# Patient Record
Sex: Male | Born: 1960 | Race: White | Hispanic: No | Marital: Married | Smoking: Never smoker
Health system: Southern US, Community
[De-identification: ages and names within clinical notes are randomized; demographics above are authoritative.]

## PROBLEM LIST (undated history)

## (undated) DIAGNOSIS — K219 Gastro-esophageal reflux disease without esophagitis: Secondary | ICD-10-CM

## (undated) HISTORY — PX: WRIST ARTHROPLASTY: SHX1088

## (undated) HISTORY — PX: ANKLE SURGERY: SHX546

## (undated) HISTORY — PX: TONSILLECTOMY: SUR1361

---

## 2001-01-24 ENCOUNTER — Other Ambulatory Visit
Admission: RE | Admit: 2001-01-24 | Discharge: 2001-01-24 | Disposition: A | Discharge status: Home / Self Care | Payer: BC Managed Care – PPO | Source: Ambulatory Visit | Attending: Urology | Admitting: Urology

## 2001-01-24 DIAGNOSIS — Z302 Encounter for sterilization: Secondary | ICD-10-CM | POA: Insufficient documentation

## 2003-06-11 ENCOUNTER — Encounter: Admission: RE | Admit: 2003-06-11 | Discharge: 2003-06-11 | Payer: Self-pay | Admitting: Gastroenterology

## 2003-07-22 ENCOUNTER — Encounter: Admission: RE | Admit: 2003-07-22 | Discharge: 2003-07-22 | Payer: Self-pay | Admitting: Gastroenterology

## 2004-03-24 ENCOUNTER — Ambulatory Visit (HOSPITAL_BASED_OUTPATIENT_CLINIC_OR_DEPARTMENT_OTHER): Admission: RE | Admit: 2004-03-24 | Discharge: 2004-03-24 | Payer: Self-pay

## 2004-03-24 ENCOUNTER — Ambulatory Visit (HOSPITAL_COMMUNITY): Admission: RE | Admit: 2004-03-24 | Discharge: 2004-03-24 | Payer: Self-pay

## 2005-08-13 HISTORY — PX: INGUINAL HERNIA REPAIR: SUR1180

## 2010-08-13 HISTORY — PX: SHOULDER ARTHROSCOPY: SHX128

## 2011-06-11 ENCOUNTER — Ambulatory Visit (HOSPITAL_COMMUNITY)
Admission: RE | Admit: 2011-06-11 | Discharge: 2011-06-11 | Disposition: A | Discharge status: Home / Self Care | Payer: BC Managed Care – PPO | Source: Ambulatory Visit | Attending: Orthopedic Surgery | Admitting: Orthopedic Surgery

## 2011-06-11 ENCOUNTER — Other Ambulatory Visit (HOSPITAL_COMMUNITY): Payer: Self-pay | Admitting: Orthopedic Surgery

## 2011-06-11 DIAGNOSIS — M25519 Pain in unspecified shoulder: Secondary | ICD-10-CM | POA: Insufficient documentation

## 2011-06-11 DIAGNOSIS — Z01818 Encounter for other preprocedural examination: Secondary | ICD-10-CM | POA: Insufficient documentation

## 2011-06-11 DIAGNOSIS — R52 Pain, unspecified: Secondary | ICD-10-CM

## 2011-07-02 ENCOUNTER — Encounter (HOSPITAL_BASED_OUTPATIENT_CLINIC_OR_DEPARTMENT_OTHER): Payer: Self-pay | Admitting: *Deleted

## 2011-07-02 NOTE — Progress Notes (Signed)
Pt instructed NPO p MN except simvastatin, nexium, androgel.  To [facility] 11/26 @ 1030.  Needs hgb, ekg on arrival.

## 2011-07-09 ENCOUNTER — Encounter (HOSPITAL_BASED_OUTPATIENT_CLINIC_OR_DEPARTMENT_OTHER): Payer: Self-pay | Admitting: Anesthesiology

## 2011-07-09 ENCOUNTER — Ambulatory Visit (HOSPITAL_BASED_OUTPATIENT_CLINIC_OR_DEPARTMENT_OTHER): Payer: BC Managed Care – PPO | Admitting: Anesthesiology

## 2011-07-09 ENCOUNTER — Ambulatory Visit (HOSPITAL_BASED_OUTPATIENT_CLINIC_OR_DEPARTMENT_OTHER)
Admission: RE | Admit: 2011-07-09 | Discharge: 2011-07-09 | Disposition: A | Discharge status: Home / Self Care | Payer: BC Managed Care – PPO | Source: Ambulatory Visit | Attending: Orthopedic Surgery | Admitting: Orthopedic Surgery

## 2011-07-09 ENCOUNTER — Other Ambulatory Visit: Payer: Self-pay

## 2011-07-09 ENCOUNTER — Encounter (HOSPITAL_BASED_OUTPATIENT_CLINIC_OR_DEPARTMENT_OTHER)
Admission: RE | Disposition: A | Discharge status: Home / Self Care | Payer: Self-pay | Source: Ambulatory Visit | Attending: Orthopedic Surgery

## 2011-07-09 DIAGNOSIS — K219 Gastro-esophageal reflux disease without esophagitis: Secondary | ICD-10-CM | POA: Insufficient documentation

## 2011-07-09 DIAGNOSIS — S43439A Superior glenoid labrum lesion of unspecified shoulder, initial encounter: Secondary | ICD-10-CM

## 2011-07-09 DIAGNOSIS — M719 Bursopathy, unspecified: Secondary | ICD-10-CM | POA: Insufficient documentation

## 2011-07-09 DIAGNOSIS — Z79899 Other long term (current) drug therapy: Secondary | ICD-10-CM | POA: Insufficient documentation

## 2011-07-09 DIAGNOSIS — M67919 Unspecified disorder of synovium and tendon, unspecified shoulder: Secondary | ICD-10-CM | POA: Insufficient documentation

## 2011-07-09 DIAGNOSIS — M24119 Other articular cartilage disorders, unspecified shoulder: Secondary | ICD-10-CM | POA: Insufficient documentation

## 2011-07-09 DIAGNOSIS — M19019 Primary osteoarthritis, unspecified shoulder: Secondary | ICD-10-CM | POA: Insufficient documentation

## 2011-07-09 HISTORY — DX: Gastro-esophageal reflux disease without esophagitis: K21.9

## 2011-07-09 LAB — POCT HEMOGLOBIN-HEMACUE: Hemoglobin: 16.6 g/dL (ref 13.0–17.0)

## 2011-07-09 SURGERY — ARTHROSCOPY, SHOULDER, WITH GLENOID LABRUM REPAIR
Anesthesia: General | Laterality: Right

## 2011-07-09 MED ORDER — ROCURONIUM BROMIDE 100 MG/10ML IV SOLN
INTRAVENOUS | Status: DC | PRN
  Administered 2011-07-09: 40 mg via INTRAVENOUS

## 2011-07-09 MED ORDER — EPINEPHRINE HCL 1 MG/ML IJ SOLN
INTRAMUSCULAR | Status: DC | PRN
  Administered 2011-07-09: 1 mg

## 2011-07-09 MED ORDER — BUPIVACAINE-EPINEPHRINE 0.5% -1:200000 IJ SOLN
INTRAMUSCULAR | Status: DC | PRN
  Administered 2011-07-09: 12 mL

## 2011-07-09 MED ORDER — NEOSTIGMINE METHYLSULFATE 1 MG/ML IJ SOLN
INTRAMUSCULAR | Status: DC | PRN
  Administered 2011-07-09: 4 mg via INTRAVENOUS

## 2011-07-09 MED ORDER — MIDAZOLAM HCL 5 MG/5ML IJ SOLN
INTRAMUSCULAR | Status: DC | PRN
  Administered 2011-07-09: 2 mg via INTRAVENOUS

## 2011-07-09 MED ORDER — KETOROLAC TROMETHAMINE 30 MG/ML IJ SOLN
INTRAMUSCULAR | Status: DC | PRN
  Administered 2011-07-09: 30 mg via INTRAVENOUS

## 2011-07-09 MED ORDER — METHOCARBAMOL 500 MG PO TABS: ORAL_TABLET | ORAL | Status: DC

## 2011-07-09 MED ORDER — FENTANYL CITRATE 0.05 MG/ML IJ SOLN
100.0000 ug | Freq: Once | INTRAMUSCULAR | Status: AC
  Administered 2011-07-09: 100 ug via INTRAVENOUS

## 2011-07-09 MED ORDER — DEXAMETHASONE SODIUM PHOSPHATE 4 MG/ML IJ SOLN
INTRAMUSCULAR | Status: DC | PRN
  Administered 2011-07-09: 4 mg via INTRAVENOUS

## 2011-07-09 MED ORDER — LACTATED RINGERS IV SOLN
INTRAVENOUS | Status: DC
  Administered 2011-07-09 (×2): via INTRAVENOUS

## 2011-07-09 MED ORDER — FENTANYL CITRATE 0.05 MG/ML IJ SOLN
INTRAMUSCULAR | Status: DC | PRN
  Administered 2011-07-09: 100 ug via INTRAVENOUS
  Administered 2011-07-09 (×2): 50 ug via INTRAVENOUS

## 2011-07-09 MED ORDER — KETOROLAC TROMETHAMINE 30 MG/ML IJ SOLN: 15.0000 mg | Freq: Once | INTRAMUSCULAR | Status: DC | PRN

## 2011-07-09 MED ORDER — LACTATED RINGERS IV SOLN
INTRAVENOUS | Status: DC | PRN
  Administered 2011-07-09 (×3): via INTRAVENOUS

## 2011-07-09 MED ORDER — PROPOFOL 10 MG/ML IV EMUL
INTRAVENOUS | Status: DC | PRN
  Administered 2011-07-09: 200 mg via INTRAVENOUS

## 2011-07-09 MED ORDER — GLYCOPYRROLATE 0.2 MG/ML IJ SOLN
INTRAMUSCULAR | Status: DC | PRN
  Administered 2011-07-09: .6 mg via INTRAVENOUS

## 2011-07-09 MED ORDER — LIDOCAINE HCL (CARDIAC) 20 MG/ML IV SOLN
INTRAVENOUS | Status: DC | PRN
  Administered 2011-07-09: 100 mg via INTRAVENOUS

## 2011-07-09 MED ORDER — ONDANSETRON HCL 4 MG/2ML IJ SOLN
INTRAMUSCULAR | Status: DC | PRN
  Administered 2011-07-09: 4 mg via INTRAVENOUS

## 2011-07-09 MED ORDER — METOCLOPRAMIDE HCL 5 MG/ML IJ SOLN
10.0000 mg | Freq: Once | INTRAMUSCULAR | Status: AC
  Administered 2011-07-09: 10 mg via INTRAVENOUS

## 2011-07-09 MED ORDER — HYDROMORPHONE HCL PF 1 MG/ML IJ SOLN: 0.2500 mg | INTRAMUSCULAR | Status: DC | PRN

## 2011-07-09 MED ORDER — SODIUM CHLORIDE 0.9 % IR SOLN
Status: DC | PRN
  Administered 2011-07-09: 3000 mL

## 2011-07-09 MED ORDER — ROPIVACAINE HCL 5 MG/ML IJ SOLN
INTRAMUSCULAR | Status: DC | PRN
  Administered 2011-07-09: 25 mL

## 2011-07-09 MED ORDER — MIDAZOLAM HCL 2 MG/2ML IJ SOLN
2.0000 mg | Freq: Once | INTRAMUSCULAR | Status: AC
  Administered 2011-07-09: 2 mg via INTRAVENOUS

## 2011-07-09 SURGICAL SUPPLY — 62 items
APL SKNCLS STERI-STRIP NONHPOA (GAUZE/BANDAGES/DRESSINGS) ×1
BENZOIN TINCTURE PRP APPL 2/3 (GAUZE/BANDAGES/DRESSINGS) ×2 IMPLANT
BLADE 4.2CUDA (BLADE) ×2 IMPLANT
BLADE CUDA 4.2 (BLADE) IMPLANT
BLADE CUDA 5.5 (BLADE) IMPLANT
BLADE CUDA SHAVER 3.5 (BLADE) IMPLANT
BLADE CUTTER GATOR 3.5 (BLADE) IMPLANT
BLADE FLAT COURSE (BLADE) IMPLANT
BLADE GREAT WHITE 4.2 (BLADE) IMPLANT
BUR OVAL 4.0 (BURR) ×2 IMPLANT
CANISTER SUCT LVC 12 LTR MEDI- (MISCELLANEOUS) ×4 IMPLANT
CANISTER SUCTION 1200CC (MISCELLANEOUS) ×2 IMPLANT
CLOTH BEACON ORANGE TIMEOUT ST (SAFETY) ×2 IMPLANT
DRAPE LG THREE QUARTER DISP (DRAPES) ×4 IMPLANT
DRAPE SHOULDER BEACH CHAIR (DRAPES) ×2 IMPLANT
DRAPE U-SHAPE 47X51 STRL (DRAPES) ×2 IMPLANT
DRSG ADAPTIC 3X8 NADH LF (GAUZE/BANDAGES/DRESSINGS) ×2 IMPLANT
DRSG PAD ABDOMINAL 8X10 ST (GAUZE/BANDAGES/DRESSINGS) ×2 IMPLANT
DURAPREP 26ML APPLICATOR (WOUND CARE) ×2 IMPLANT
ELECT MENISCUS 165MM 90D (ELECTRODE) IMPLANT
ELECT REM PT RETURN 9FT ADLT (ELECTROSURGICAL) ×2
ELECTRODE REM PT RTRN 9FT ADLT (ELECTROSURGICAL) ×1 IMPLANT
GLOVE ECLIPSE 8.0 STRL XLNG CF (GLOVE) ×4 IMPLANT
GLOVE INDICATOR 6.5 STRL GRN (GLOVE) ×2 IMPLANT
GLOVE INDICATOR 8.0 STRL GRN (GLOVE) ×2 IMPLANT
GOWN PREVENTION PLUS LG XLONG (DISPOSABLE) ×3 IMPLANT
GOWN STRL REIN XL XLG (GOWN DISPOSABLE) ×3 IMPLANT
IV NS IRRIG 3000ML ARTHROMATIC (IV SOLUTION) ×3 IMPLANT
NDL 1/2 CIR CATGUT .05X1.09 (NEEDLE) IMPLANT
NDL HYPO 18GX1.5 BLUNT FILL (NEEDLE) ×1 IMPLANT
NDL SAFETY ECLIPSE 18X1.5 (NEEDLE) ×1 IMPLANT
NEEDLE 1/2 CIR CATGUT .05X1.09 (NEEDLE) IMPLANT
NEEDLE HYPO 18GX1.5 BLUNT FILL (NEEDLE) ×2 IMPLANT
NEEDLE HYPO 18GX1.5 SHARP (NEEDLE) ×2
NS IRRIG 500ML POUR BTL (IV SOLUTION) ×2 IMPLANT
PACK ARTHROSCOPY DSU (CUSTOM PROCEDURE TRAY) ×2 IMPLANT
PACK BASIN DAY SURGERY FS (CUSTOM PROCEDURE TRAY) ×2 IMPLANT
PENCIL BUTTON HOLSTER BLD 10FT (ELECTRODE) IMPLANT
SET ARTHROSCOPY TUBING (MISCELLANEOUS) ×2
SET ARTHROSCOPY TUBING LN (MISCELLANEOUS) ×1 IMPLANT
SLING ARM IMMOBILIZER LRG (SOFTGOODS) ×1 IMPLANT
SPONGE GAUZE 4X4 12PLY (GAUZE/BANDAGES/DRESSINGS) ×2 IMPLANT
SPONGE SURGIFOAM ABS GEL 100 (HEMOSTASIS) IMPLANT
STRIP CLOSURE SKIN 1/2X4 (GAUZE/BANDAGES/DRESSINGS) ×2 IMPLANT
SUCTION FRAZIER TIP 10 FR DISP (SUCTIONS) ×2 IMPLANT
SUT BONE WAX W31G (SUTURE) IMPLANT
SUT ETHIBOND GREEN BRAID 0S 4 (SUTURE) IMPLANT
SUT ETHIBOND NAB CT1 #1 30IN (SUTURE) IMPLANT
SUT ETHILON 4 0 PS 2 18 (SUTURE) ×3 IMPLANT
SUT VIC AB 0 CT1 36 (SUTURE) IMPLANT
SUT VIC AB 1 CT1 36 (SUTURE) ×2 IMPLANT
SUT VIC AB 2-0 CT1 27 (SUTURE)
SUT VIC AB 2-0 CT1 TAPERPNT 27 (SUTURE) ×2 IMPLANT
SUT VIC AB 3-0 SH 27 (SUTURE)
SUT VIC AB 3-0 SH 27X BRD (SUTURE) IMPLANT
SYR BULB IRRIGATION 50ML (SYRINGE) ×2 IMPLANT
SYRINGE 10CC LL (SYRINGE) ×2 IMPLANT
TAPE HYPAFIX 6X30 (GAUZE/BANDAGES/DRESSINGS) IMPLANT
TOWEL OR 17X24 6PK STRL BLUE (TOWEL DISPOSABLE) ×2 IMPLANT
TUBE CONNECTING 12X1/4 (SUCTIONS) ×2 IMPLANT
WAND 90 DEG TURBOVAC W/CORD (SURGICAL WAND) ×2 IMPLANT
WATER STERILE IRR 500ML POUR (IV SOLUTION) ×2 IMPLANT

## 2011-07-09 NOTE — H&P (Signed)
Zachary Russell is an 50 y.o. male.   Chief Complaint: Rt shoulder pain HPI: MRI demonstrates a torn labrum and rotator cuff tendonitis  Past Medical History  Diagnosis Date  . GERD (gastroesophageal reflux disease)     uses nexium prn    Past Surgical History  Procedure Date  . Inguinal hernia repair 2007    left  . Tonsillectomy   . Ankle surgery     left    History reviewed. No pertinent family history. Social History:  reports that he has never smoked. He does not have any smokeless tobacco history on file. He reports that he drinks alcohol. He reports that he does not use illicit drugs.  Allergies: No Known Allergies  Medications Prior to Admission  Medication Dose Route Frequency Provider Last Rate Last Dose  . fentaNYL (SUBLIMAZE) injection 100 mcg  100 mcg Intravenous Once Riesa Pope, MD   100 mcg at 07/09/11 1211  . lactated ringers infusion   Intravenous Continuous Phillips Grout, MD 100 mL/hr at 07/09/11 1116    . midazolam (VERSED) injection 2 mg  2 mg Intravenous Once Riesa Pope, MD   2 mg at 07/09/11 1212   Medications Prior to Admission  Medication Sig Dispense Refill  . esomeprazole (NEXIUM) 40 MG capsule Take 40 mg by mouth daily as needed.        . fish oil-omega-3 fatty acids 1000 MG capsule Take 1 g by mouth daily.        Marland Kitchen ibuprofen (ADVIL,MOTRIN) 200 MG tablet Take 600 mg by mouth every 6 (six) hours as needed.        . meperidine (DEMEROL) 50 MG tablet Take 50 mg by mouth every 4 (four) hours as needed.        . multivitamin (THERAGRAN) per tablet Take 1 tablet by mouth daily.        . naproxen sodium (ANAPROX) 220 MG tablet Take 220 mg by mouth 2 (two) times daily with a meal.        . simvastatin (ZOCOR) 40 MG tablet Take 40 mg by mouth daily.        Marland Kitchen testosterone (ANDROGEL) 50 MG/5GM GEL Place 5 g onto the skin daily.          Results for orders placed during the hospital encounter of 07/09/11 (from the past 48 hour(s))  POCT  HEMOGLOBIN-HEMACUE     Status: Normal   Collection Time   07/09/11 11:19 AM      Component Value Range Comment   Hemoglobin 16.6  13.0 - 17.0 (g/dL)    No results found.  ROS  Blood pressure 133/84, pulse 71, temperature 98 F (36.7 C), temperature source Oral, resp. rate 10, height 5\' 7"  (1.702 m), weight 81.647 kg (180 lb), SpO2 98.00%. Physical Exam  Vitals reviewed. Constitutional: He is oriented to person, place, and time. He appears well-developed and well-nourished.  HENT:  Head: Normocephalic and atraumatic.  Right Ear: External ear normal.  Left Ear: External ear normal.  Nose: Nose normal.  Mouth/Throat: Oropharynx is clear and moist.  Eyes: EOM are normal. Pupils are equal, round, and reactive to light.  Neck: Normal range of motion. Neck supple.  Cardiovascular: Normal rate, regular rhythm, normal heart sounds and intact distal pulses.   Respiratory: Effort normal and breath sounds normal.  GI: Soft. Bowel sounds are normal.  Musculoskeletal:       He has an inter scalene block  Neurological: He is alert and  oriented to person, place, and time.  Skin: Skin is warm and dry.  Psychiatric: He has a normal mood and affect. His behavior is normal. Judgment and thought content normal.     Assessment/Plan Labral tear; rotator cuff tendonitis;AC arthritis--Rt shoulder Rt shoulder arthroscopy with labral debridement,SAD,DCD Justiss Gerbino P 07/09/2011, 12:54 PM

## 2011-07-09 NOTE — Anesthesia Preprocedure Evaluation (Addendum)
Anesthesia Evaluation  Patient identified by MRN, date of birth, ID band Patient awake    Reviewed: Allergy & Precautions, H&P , NPO status , Patient's Chart, lab work & pertinent test results  Airway Mallampati: III TM Distance: >3 FB Neck ROM: Full    Dental No notable dental hx.    Pulmonary neg pulmonary ROS,  clear to auscultation  Pulmonary exam normal       Cardiovascular neg cardio ROS Regular Normal    Neuro/Psych Negative Neurological ROS  Negative Psych ROS   GI/Hepatic negative GI ROS, Neg liver ROS, GERD-  Medicated,  Endo/Other  Negative Endocrine ROS  Renal/GU negative Renal ROS  Genitourinary negative   Musculoskeletal negative musculoskeletal ROS (+)   Abdominal   Peds negative pediatric ROS (+)  Hematology negative hematology ROS (+)   Anesthesia Other Findings   Reproductive/Obstetrics negative OB ROS                           Anesthesia Physical Anesthesia Plan  ASA: II  Anesthesia Plan: General   Post-op Pain Management:    Induction: Intravenous  Airway Management Planned: Oral ETT  Additional Equipment:   Intra-op Plan:   Post-operative Plan: Extubation in OR  Informed Consent: I have reviewed the patients History and Physical, chart, labs and discussed the procedure including the risks, benefits and alternatives for the proposed anesthesia with the patient or authorized representative who has indicated his/her understanding and acceptance.   Dental advisory given  Plan Discussed with: CRNA  Anesthesia Plan Comments:        Anesthesia Quick Evaluation

## 2011-07-09 NOTE — Transfer of Care (Signed)
Immediate Anesthesia Transfer of Care Note  Patient: Zachary Russell  Procedure(s) Performed:  SHOULDER ARTHROSCOPY WITH LABRAL REPAIR - RIGHT SHOULDER ARTHROSCOPY WITH LABRAL DEBRIDEMENT AND SUBACROMIAL DECOMPRESSION AND DISTAL CLAVICLE DECOMPRESSION   Patient Location: PACU  Anesthesia Type: General  Level of Consciousness: awake, sedated, patient cooperative and responds to stimulation  Airway & Oxygen Therapy: Patient Spontanous Breathing and Patient connected to face mask oxygen  Post-op Assessment: Report given to PACU RN, Post -op Vital signs reviewed and stable and Patient moving all extremities  Post vital signs: Reviewed and stable  Complications: No apparent anesthesia complications

## 2011-07-09 NOTE — Op Note (Signed)
NAMEYAKOV, BERGEN NO.:  [id number]  MEDICAL RECORD NO.:  [id number]  LOCATION:                               FACILITY:  [facility]  PHYSICIAN:  Marlowe Kays, M.D.  DATE OF BIRTH:  [birth year]  DATE OF PROCEDURE:  07/09/2011 DATE OF DISCHARGE:                              OPERATIVE REPORT   PREOPERATIVE DIAGNOSES: 1. Torn labrum. 2. Rotator cuff tendinopathy, right shoulder.  POSTOPERATIVE DIAGNOSES: 1. Torn labrum. 2. Rotator cuff tendinopathy, right shoulder.  OPERATION:  Right shoulder arthroscopy with debridement of labrum; arthroscopic subacromial decompression; arthroscopic distal clavicle decompression.  SURGEON:  Marlowe Kays, M.D.  ASSISTANTDruscilla Brownie. Cherlynn June.  ANESTHESIA:  General.  PATHOLOGY INDICATION FOR PROCEDURE:  Painful right shoulder MRI demonstrating the preoperative diagnoses.  These were confirmed at surgery.  Mr. Angie Fava assistance was required for technical assistance, handling of instrumentation and movement of the arm and holding of the arm during the case.  PROCEDURE:  Interscalene block by anesthesiologist.  Satisfied general anesthesia, beach chair position on the sliding frame.  The right shoulder girdle was prepped with DuraPrep, draped in sterile field. Time-out performed.  The anatomy of shoulder joint was marked out and I infiltrated posterior and lateral portals with Marcaine with adrenaline for hemostatic purposes as well as subacromial space.  Through posterior soft spot portal, I atraumatically entered the glenohumeral joint, and on inspection, found no abnormalities except for fairly significant labral tear near the biceps anchor.  After picturing this, I then advanced the scope between the subscapularis and biceps tendon, and made an anterior incision over switching stick.  Over the switching stick, I placed a metal cannula followed by 4.2 shaver entering the joint.  I then debrided down the  labrum until smooth.  Final picture was taken.  I then redirected the scope to the subacromial space, and through lateral portal, introduced a 4.2 shaver.  He had a large amount of bursal tissue, which took a while to evacuate.  I then used ArthroCare vaporizer to remove soft tissue from the undersurface of the acromion and distal clavicle, then followed this with a 4 mm oval bur, and I went back and forth between the bur and the vaporizer until we had a wide decompression and adequate soft tissue release.  I then also smoothed the superior rotator cuff which was frayed.  I did not see any frank tear.  After moving all fluid, there was no unusual bleeding.  We closed 3 portals with 4-0 nylon. Betadine, Adaptic, dry sterile dressing were applied by shoulder immobilizer.  He tolerated the procedure well, was taken to recovery room in satisfied condition with no known complications.          ______________________________ Marlowe Kays, M.D.     JA/MEDQ  D:  07/09/2011  T:  07/09/2011  Job:  [id number]

## 2011-07-09 NOTE — Progress Notes (Signed)
In Pacu for nerve block

## 2011-07-09 NOTE — Brief Op Note (Signed)
07/09/2011}  2:51 PM  PATIENT:  Zachary Russell  50 y.o. male  PRE-OPERATIVE DIAGNOSIS:  RIGHT SHOULDER LABRAL TEAR;Rotator cuff tendinopathy  POST-OPERATIVE DIAGNOSIS:  RIGHT SHOULDER LABRAL TEAR;Rotator cuff tendinopathy  PROCEDURE:  Procedure(s): SHOULDER ARTHROSCOPY WITH LABRAL REPAIR ,SAD,DCD  SURGEON:  Surgeon(s): Vidya Bamford P Yentl Verge  PHYSICIAN ASSISTANT:   ASSISTANTS:  Mr Gregery Na  ANESTHESIA:   regional and general  EBL:  Total I/O In: 1000 [I.V.:1000] Out: -   BLOOD ADMINISTERED:none  DRAINS: none   LOCAL MEDICATIONS USED:  MARCAINE 10CC  SPECIMEN:  No Specimen  DISPOSITION OF SPECIMEN:  [id number]  COUNTS:  YES  TOURNIQUET:  * No tourniquets in log *  DICTATION: .Other Dictation: Dictation Number [id number]  PLAN OF CARE: Discharge to home after PACU  PATIENT DISPOSITION:  PACU - hemodynamically stable.   Delay start of Pharmacological VTE agent (>24hrs) due to surgical blood loss or risk of bleeding:  {YES/NO/NOT APPLICABLE:20182

## 2011-07-09 NOTE — Anesthesia Procedure Notes (Addendum)
Anesthesia Regional Block:  Interscalene brachial plexus block  Pre-Anesthetic Checklist: ,, timeout performed, Correct Patient, Correct Site, Correct Laterality, Correct Procedure, Correct Position, site marked, Risks and benefits discussed,  Surgical consent,  Pre-op evaluation,  At surgeon's request and post-op pain management  Laterality: Right  Prep: chloraprep       Needles:  Injection technique: Single-shot  Needle Type: Stimiplex          Additional Needles:  Procedures: ultrasound guided Interscalene brachial plexus block Narrative:  Start time: 07/09/2011 12:00 PM End time: 07/09/2011 12:15 PM  Performed by: Personally   Additional Notes: Patient tolerated the procedure well without complications  Interscalene brachial plexus block Procedure Name: Intubation Date/Time: 07/09/2011 1:17 PM Performed by: Iline Oven Pre-anesthesia Checklist: Patient identified, Emergency Drugs available, Suction available and Patient being monitored Patient Re-evaluated:Patient Re-evaluated prior to inductionOxygen Delivery Method: Circle System Utilized Preoxygenation: Pre-oxygenation with 100% oxygen Intubation Type: IV induction Ventilation: Mask ventilation without difficulty Laryngoscope Size: Mac and 4 Grade View: Grade I Tube type: Oral Tube size: 8.0 mm Number of attempts: 1 Airway Equipment and Method: stylet and oral airway Placement Confirmation: ETT inserted through vocal cords under direct vision,  positive ETCO2 and breath sounds checked- equal and bilateral Tube secured with: Tape Dental Injury: Teeth and Oropharynx as per pre-operative assessment     Performed by: Iline Oven

## 2011-07-09 NOTE — Anesthesia Postprocedure Evaluation (Signed)
  Anesthesia Post-op Note  Patient: Zachary Russell  Procedure(s) Performed:  SHOULDER ARTHROSCOPY WITH LABRAL REPAIR - RIGHT SHOULDER ARTHROSCOPY WITH LABRAL DEBRIDEMENT AND SUBACROMIAL DECOMPRESSION AND DISTAL CLAVICLE DECOMPRESSION   Patient Location: PACU  Anesthesia Type: GA combined with regional for post-op pain  Level of Consciousness: awake and alert   Airway and Oxygen Therapy: Patient Spontanous Breathing  Post-op Pain: mild  Post-op Assessment: Post-op Vital signs reviewed, Patient's Cardiovascular Status Stable, Respiratory Function Stable, Patent Airway and No signs of Nausea or vomiting  Post-op Vital Signs: stable  Complications: No apparent anesthesia complications

## 2011-07-16 NOTE — Addendum Note (Signed)
Addendum  created 07/16/11 1215 by Lorrin Jackson   Modules edited:Anesthesia Responsible Staff

## 2011-07-16 NOTE — Addendum Note (Signed)
Addendum  created 07/16/11 1215 by Renalda Locklin   Modules edited:Anesthesia Responsible Staff    

## 2011-07-26 ENCOUNTER — Encounter (HOSPITAL_BASED_OUTPATIENT_CLINIC_OR_DEPARTMENT_OTHER): Payer: Self-pay

## 2013-08-13 ENCOUNTER — Encounter (HOSPITAL_COMMUNITY): Payer: Self-pay | Admitting: Emergency Medicine

## 2013-08-13 ENCOUNTER — Emergency Department (HOSPITAL_COMMUNITY)
Admission: EM | Admit: 2013-08-13 | Discharge: 2013-08-13 | Disposition: A | Discharge status: Home / Self Care | Payer: BC Managed Care – PPO | Attending: Emergency Medicine | Admitting: Emergency Medicine

## 2013-08-13 ENCOUNTER — Emergency Department (HOSPITAL_COMMUNITY): Payer: BC Managed Care – PPO

## 2013-08-13 DIAGNOSIS — Y92009 Unspecified place in unspecified non-institutional (private) residence as the place of occurrence of the external cause: Secondary | ICD-10-CM | POA: Insufficient documentation

## 2013-08-13 DIAGNOSIS — Y93H9 Activity, other involving exterior property and land maintenance, building and construction: Secondary | ICD-10-CM | POA: Insufficient documentation

## 2013-08-13 DIAGNOSIS — Z79899 Other long term (current) drug therapy: Secondary | ICD-10-CM | POA: Insufficient documentation

## 2013-08-13 DIAGNOSIS — Z791 Long term (current) use of non-steroidal anti-inflammatories (NSAID): Secondary | ICD-10-CM | POA: Insufficient documentation

## 2013-08-13 DIAGNOSIS — S52502A Unspecified fracture of the lower end of left radius, initial encounter for closed fracture: Secondary | ICD-10-CM

## 2013-08-13 DIAGNOSIS — IMO0002 Reserved for concepts with insufficient information to code with codable children: Secondary | ICD-10-CM | POA: Insufficient documentation

## 2013-08-13 DIAGNOSIS — K219 Gastro-esophageal reflux disease without esophagitis: Secondary | ICD-10-CM | POA: Insufficient documentation

## 2013-08-13 DIAGNOSIS — W010XXA Fall on same level from slipping, tripping and stumbling without subsequent striking against object, initial encounter: Secondary | ICD-10-CM | POA: Insufficient documentation

## 2013-08-13 DIAGNOSIS — M25559 Pain in unspecified hip: Secondary | ICD-10-CM | POA: Insufficient documentation

## 2013-08-13 DIAGNOSIS — S52599A Other fractures of lower end of unspecified radius, initial encounter for closed fracture: Secondary | ICD-10-CM | POA: Insufficient documentation

## 2013-08-13 DIAGNOSIS — H5789 Other specified disorders of eye and adnexa: Secondary | ICD-10-CM | POA: Insufficient documentation

## 2013-08-13 MED ORDER — OXYCODONE-ACETAMINOPHEN 5-325 MG PO TABS: 1.0000 | ORAL_TABLET | ORAL | Status: DC | PRN

## 2013-08-13 MED ORDER — OXYCODONE-ACETAMINOPHEN 5-325 MG PO TABS
1.0000 | ORAL_TABLET | Freq: Once | ORAL | Status: AC
  Administered 2013-08-13: 1 via ORAL
  Filled 2013-08-13: qty 1

## 2013-08-13 MED ORDER — ONDANSETRON 8 MG PO TBDP
8.0000 mg | ORAL_TABLET | Freq: Once | ORAL | Status: AC
  Administered 2013-08-13: 8 mg via ORAL
  Filled 2013-08-13: qty 2

## 2013-08-13 NOTE — ED Notes (Addendum)
Pt comes from [facility] on [address] with c-d of xray to left wrist.  Pt was told he has a left wrist fracture and was directed to ED to see an orthpedist.  Pt slipped on some frost this morning while trying to take down Christmas decorations.  He broke his fall with his left arm.  Pt has some swelling and redness to left wrist.  Pt c/o nausea and pain.  Pt also hit his left ribs when he fell.  Pt denies hitting head when he fell.

## 2013-08-13 NOTE — Discharge Instructions (Signed)
Read the information below.  Use the prescribed medication as directed.  Please discuss all new medications with your pharmacist.  Do not take additional tylenol while taking the prescribed pain medication to avoid overdose.  You may return to the Emergency Department at any time for worsening condition or any new symptoms that concern you.  If you develop uncontrolled pain, weakness or numbness of the extremity, severe discoloration of the skin, or you are unable to move your hand, return to the ER for a recheck.      Cast or Splint Care Casts and splints support injured limbs and keep bones from moving while they heal. It is important to care for your cast or splint at home.  HOME CARE INSTRUCTIONS  Keep the cast or splint uncovered during the drying period. It can take 24 to 48 hours to dry if it is made of plaster. A fiberglass cast will dry in less than 1 hour.  Do not rest the cast on anything harder than a pillow for the first 24 hours.  Do not put weight on your injured limb or apply pressure to the cast until your caregiver gives you permission.  Keep the cast or splint dry. Wet casts or splints can lose their shape and may not support the limb as well. Also, wet skin can become infected. Cover the cast or splint with a plastic bag when bathing or when out in the rain or snow. If the cast is on the trunk of the body, take sponge baths until the cast is removed.  Keep your cast or splint clean. Soiled casts may be wiped with a moistened cloth.  Do not place any foreign objects under your cast or splint. Do not try to scratch the skin under the cast with any object. The object could get stuck inside the cast. Also, scratching could lead to an infection.  Do not remove padding from inside your cast.  Exercise all joints next to the injury that are not immobilized by the cast or splint. For example, if you have a long leg cast, exercise the hip joint and toes. If you have an arm cast or  splint, exercise the shoulder, elbow, thumb, and fingers.  Elevate your injured arm or leg on 1 or 2 pillows for the first 1 to 3 days to decrease swelling and pain.It is best if you can comfortably elevate your cast so it is higher than your heart. SEEK MEDICAL CARE IF:   Your cast or splint cracks.  Your cast or splint is too tight or too loose.  You experience unbearable itching inside the cast.  Your cast becomes wet or develops a soft spot or area.  You have a bad smell coming from inside your cast.  You get an object stuck under your cast.  Your skin around the cast becomes red or raw.  You develop a new pain or worsening pain after the cast has been applied. SEEK IMMEDIATE MEDICAL CARE IF:   You have fluid leaking through the cast.  You are unable to move your fingers or toes.  You have discolored, cool, painful, or very swollen fingers or toes beyond the cast.  You have tingling or numbness around the injured area.  You have severe pain or pressure under the cast.  You develop any difficulty with your breathing or have shortness of breath.  You develop chest pain. Document Released: 07/27/2000 Document Revised: 10/22/2011 Document Reviewed: 02/05/2013 [facility] Patient Information 2014 [place], [place].  Radial Fracture You have a broken bone (fracture) of the forearm. This is the part of your arm between the elbow and your wrist. Your forearm is made up of two bones. These are the radius and ulna. Your fracture is in the radial shaft. This is the bone in your forearm located on the thumb side. A cast or splint is used to protect and keep your injured bone from moving. The cast or splint will be on generally for about 5 to 6 weeks, with individual variations. HOME CARE INSTRUCTIONS   Keep the injured part elevated while sitting or lying down. Keep the injury above the level of your heart (the center of the chest). This will decrease swelling and pain.  Apply ice  to the injury for 15-20 minutes, 03-04 times per day while awake, for 2 days. Put the ice in a plastic bag and place a towel between the bag of ice and your cast or splint.  Move your fingers to avoid stiffness and minimize swelling.  If you have a plaster or fiberglass cast:  Do not try to scratch the skin under the cast using sharp or pointed objects.  Check the skin around the cast every day. You may put lotion on any red or sore areas.  Keep your cast dry and clean.  If you have a plaster splint:  Wear the splint as directed.  You may loosen the elastic around the splint if your fingers become numb, tingle, or turn cold or blue.  Do not put pressure on any part of your cast or splint. It may break. Rest your cast only on a pillow for the first 24 hours until it is fully hardened.  Your cast or splint can be protected during bathing with a plastic bag. Do not lower the cast or splint into water.  Only take over-the-counter or prescription medicines for pain, discomfort, or fever as directed by your caregiver. SEEK IMMEDIATE MEDICAL CARE IF:   Your cast gets damaged or breaks.  You have more severe pain or swelling than you did before getting the cast.  You have severe pain when stretching your fingers.  There is a bad smell, new stains and/or pus-like (purulent) drainage coming from under the cast.  Your fingers or hand turn pale or blue and become cold or your loose feeling. Document Released: 01/10/2006 Document Revised: 10/22/2011 Document Reviewed: 04/08/2006 [facility] Patient Information 2014 [place], [place]

## 2013-08-13 NOTE — ED Notes (Signed)
Lauro RegulusQuinton Hughes, Ortho Tech, called to place splint.

## 2013-08-13 NOTE — ED Provider Notes (Signed)
CSN: [id number]     Arrival date & time 08/13/13  1223 History   First MD Initiated Contact with Patient 08/13/13 1233  This chart was scribed for non-physician practitioner Trixie DredgeEmily Florice Hindle, PA-C working with Donnetta HutchingBrian Cook, MD by Valera CastleSteven Shota, ED scribe. This patient was seen in room WTR9/WTR9 and the patient's care was started at 12:47 PM.    Chief Complaint  Patient presents with  . Wrist Pain    left wrist - possible fx    The history is provided by the patient. No language interpreter was used.   HPI Comments: Zachary Russell is a 53 y.o. male brought in from [facility], who presents to the Emergency Department complaining of sudden, severe, constant, left wrist pain, onset this morning when he fell on slick ground and landed on his left hand. He reports h/o hip pain, and reports some mild hip pain from the fall, but states that the pain has subsided. He reports small abrasion over his left, lower back, with mild, constant pain from the fall. He denies knowing if his tetanus is UTD. He reports some left eye redness unrelated to the fall, but is unsure what caused it. He denies hand pain, LOC, head trauma, numbness, weakness, and any other associated symptoms.   PCP - No primary provider on file.  Past Medical History  Diagnosis Date  . GERD (gastroesophageal reflux disease)     uses nexium prn   Past Surgical History  Procedure Laterality Date  . Inguinal hernia repair  2007    left  . Tonsillectomy    . Ankle surgery      left  . Shoulder arthroscopy Right 2012   No family history on file. History  Substance Use Topics  . Smoking status: Never Smoker   . Smokeless tobacco: Former NeurosurgeonUser  . Alcohol Use: 0.0 oz/week    1-2 Glasses of wine, 3-4 Cans of beer per week    Review of Systems  Eyes: Positive for redness (left, unrelated to fall).  Musculoskeletal: Positive for arthralgias (left wrist). Negative for gait problem and myalgias.  Skin: Positive for  wound (abrasion over left, lower back).  Neurological: Negative for syncope, weakness, numbness and headaches.    Allergies  Review of patient's allergies indicates no known allergies.  Home Medications   Current Outpatient Rx  Name  Route  Sig  Dispense  Refill  . esomeprazole (NEXIUM) 40 MG capsule   Oral   Take 40 mg by mouth daily as needed.           . fish oil-omega-3 fatty acids 1000 MG capsule   Oral   Take 1 g by mouth daily.           Marland Kitchen. ibuprofen (ADVIL,MOTRIN) 200 MG tablet   Oral   Take 600 mg by mouth every 6 (six) hours as needed.           . meperidine (DEMEROL) 50 MG tablet   Oral   Take 50 mg by mouth every 4 (four) hours as needed.           . methocarbamol (ROBAXIN) 500 MG tablet      Use as needed for spasms.   30 tablet   3   . multivitamin (THERAGRAN) per tablet   Oral   Take 1 tablet by mouth daily.           . naproxen sodium (ANAPROX) 220 MG tablet   Oral  Take 220 mg by mouth 2 (two) times daily with a meal.           . simvastatin (ZOCOR) 40 MG tablet   Oral   Take 40 mg by mouth daily.           Marland Kitchen testosterone (ANDROGEL) 50 MG/5GM GEL   Transdermal   Place 5 g onto the skin daily.            BP 146/87  Pulse 94  Temp(Src) 98.7 F (37.1 C) (Oral)  Resp 20  SpO2 98%  Physical Exam  Nursing note and vitals reviewed. Constitutional: He appears well-developed and well-nourished. No distress.  HENT:  Head: Normocephalic and atraumatic.  Neck: Neck supple.  Cardiovascular:  Left wrist cap refill < 2 seconds. Radial pulse intact.  Pulmonary/Chest: Effort normal.  Musculoskeletal:  Left hand non tender. Tenderness diffusely over left wrist, more over radial aspect. Left forearm, elbow, scapula, and clavicle non tender. Left hip non tender.  Neurological: He is alert.  Left hand sensation intact. Gait is normal.  Skin: He is not diaphoretic.  Abrasion over left, lower back, with tenderness.    ED Course   Procedures (including critical care time)  DIAGNOSTIC STUDIES: Oxygen Saturation is 98% on room air, normal by my interpretation.    COORDINATION OF CARE: 12:52 PM-Discussed treatment plan with pt at bedside and pt agreed to plan. Will discuss imaging results with pt.  1:27 PM - Discussed imaging results with pt. Pt has orthopedist in [place] he has seen in the past, and prefers to f/u with them.   Labs Review Labs Reviewed - No data to display Imaging Review Dg Wrist Complete Left  08/13/2013   CLINICAL DATA:  Fall and wrist pain.  EXAM: LEFT WRIST - COMPLETE 3+ VIEW  COMPARISON:  None.  FINDINGS: Four views of the left wrist demonstrate a distal radial fracture. Fracture is mildly comminuted and extends to the radiocarpal joint. There is also dorsal displacement of the distal radial fragment. Evidence for soft tissue swelling. Carpal bones are intact. Ulnar styloid is intact.  IMPRESSION: Displaced and comminuted fracture of the distal radius with intra-articular involvement.   Electronically Signed   By: Richarda Overlie M.D.   On: 08/13/2013 13:06    EKG Interpretation   None       MDM   1. Distal radius fracture, left, closed, initial encounter    Pt with closed distal radius fracture from [facility].  Neurovascularly intact. Pt placed in sugar tong splint, d/c home with pain medication, hand surgery (Kuzma) follow up.  Discussed all results with patient.  Pt given return precautions.  Pt verbalizes understanding and agrees with plan.       I personally performed the services described in this documentation, which was scribed in my presence. The recorded information has been reviewed and is accurate.    Trixie Dredge, PA-C 08/13/13 1630

## 2013-08-14 NOTE — ED Provider Notes (Signed)
Medical screening examination/treatment/procedure(s) were performed by non-physician practitioner and as supervising physician I was immediately available for consultation/collaboration.  EKG Interpretation   None        Donnetta HutchingBrian Kevork Joyce, MD 08/14/13 1056

## 2014-03-24 ENCOUNTER — Other Ambulatory Visit (HOSPITAL_COMMUNITY): Payer: Self-pay | Admitting: Specialist

## 2014-03-24 ENCOUNTER — Ambulatory Visit (HOSPITAL_COMMUNITY)
Admission: RE | Admit: 2014-03-24 | Discharge: 2014-03-24 | Disposition: A | Discharge status: Home / Self Care | Payer: BC Managed Care – PPO | Source: Ambulatory Visit | Attending: Specialist | Admitting: Specialist

## 2014-03-24 DIAGNOSIS — M25519 Pain in unspecified shoulder: Secondary | ICD-10-CM | POA: Insufficient documentation

## 2014-03-24 DIAGNOSIS — M25511 Pain in right shoulder: Secondary | ICD-10-CM

## 2014-06-22 ENCOUNTER — Encounter (HOSPITAL_COMMUNITY): Payer: Self-pay

## 2014-06-22 ENCOUNTER — Encounter (HOSPITAL_COMMUNITY)
Admission: RE | Admit: 2014-06-22 | Discharge: 2014-06-22 | Disposition: A | Discharge status: Home / Self Care | Payer: BC Managed Care – PPO | Source: Ambulatory Visit | Attending: Orthopedic Surgery | Admitting: Orthopedic Surgery

## 2014-06-22 DIAGNOSIS — Z01812 Encounter for preprocedural laboratory examination: Secondary | ICD-10-CM | POA: Insufficient documentation

## 2014-06-22 DIAGNOSIS — M7541 Impingement syndrome of right shoulder: Secondary | ICD-10-CM | POA: Insufficient documentation

## 2014-06-22 LAB — CBC WITH DIFFERENTIAL/PLATELET
Basophils Absolute: 0 10*3/uL (ref 0.0–0.1)
Basophils Relative: 0 % (ref 0–1)
EOS ABS: 1.1 10*3/uL — AB (ref 0.0–0.7)
Eosinophils Relative: 14 % — ABNORMAL HIGH (ref 0–5)
HCT: 43.1 % (ref 39.0–52.0)
Hemoglobin: 15.1 g/dL (ref 13.0–17.0)
LYMPHS PCT: 28 % (ref 12–46)
Lymphs Abs: 2.2 10*3/uL (ref 0.7–4.0)
MCH: 32.1 pg (ref 26.0–34.0)
MCHC: 35 g/dL (ref 30.0–36.0)
MCV: 91.7 fL (ref 78.0–100.0)
Monocytes Absolute: 0.5 10*3/uL (ref 0.1–1.0)
Monocytes Relative: 6 % (ref 3–12)
Neutro Abs: 4.1 10*3/uL (ref 1.7–7.7)
Neutrophils Relative %: 52 % (ref 43–77)
PLATELETS: 197 10*3/uL (ref 150–400)
RBC: 4.7 MIL/uL (ref 4.22–5.81)
RDW: 12.1 % (ref 11.5–15.5)
WBC: 7.9 10*3/uL (ref 4.0–10.5)

## 2014-06-22 LAB — PROTIME-INR
INR: 1.02 (ref 0.00–1.49)
PROTHROMBIN TIME: 13.5 s (ref 11.6–15.2)

## 2014-06-22 LAB — COMPREHENSIVE METABOLIC PANEL
ALBUMIN: 4.2 g/dL (ref 3.5–5.2)
ALT: 27 U/L (ref 0–53)
AST: 21 U/L (ref 0–37)
Alkaline Phosphatase: 80 U/L (ref 39–117)
Anion gap: 15 (ref 5–15)
BUN: 15 mg/dL (ref 6–23)
CO2: 25 mEq/L (ref 19–32)
Calcium: 9.8 mg/dL (ref 8.4–10.5)
Chloride: 102 mEq/L (ref 96–112)
Creatinine, Ser: 0.99 mg/dL (ref 0.50–1.35)
Glucose, Bld: 142 mg/dL — ABNORMAL HIGH (ref 70–99)
POTASSIUM: 4.4 meq/L (ref 3.7–5.3)
SODIUM: 142 meq/L (ref 137–147)
Total Bilirubin: 0.2 mg/dL — ABNORMAL LOW (ref 0.3–1.2)
Total Protein: 7.2 g/dL (ref 6.0–8.3)

## 2014-06-22 LAB — APTT: aPTT: 30 seconds (ref 24–37)

## 2014-06-22 NOTE — Pre-Procedure Instructions (Signed)
Zachary Russell  06/22/2014   Your procedure is scheduled on:  07/01/14  [facility] at 8 AM.  Call this number if you have problems the morning of surgery: [phone]   Remember:   Do not eat food or drink liquids after midnight.   Take these medicines the morning of surgery with A SIP OF WATER: nexium,oxycodone   Do not wear jewelry, make-up or nail polish.  Do not wear lotions, powders, or perfumes. You may wear deodorant.  Do not shave 48 hours prior to surgery. Men may shave face and neck.  Do not bring valuables to the hospital.  [facility] is not responsible                  for any belongings or valuables.               Contacts, dentures or bridgework may not be worn into surgery.  Leave suitcase in the car. After surgery it may be brought to your room.  For patients admitted to the hospital, discharge time is determined by your                treatment team.               Patients discharged the day of surgery will not be allowed to drive  home.  Name and phone number of your driver:   Special Instructions: Shower using CHG 2 nights before surgery and the night before surgery.  If you shower the day of surgery use CHG.  Use special wash - you have one bottle of CHG for all showers.  You should use approximately 1/3 of the bottle for each shower.   Please read over the following fact sheets that you were given: Pain Booklet, Coughing and Deep Breathing, MRSA Information and Surgical Site Infection Prevention

## 2014-06-30 MED ORDER — CHLORHEXIDINE GLUCONATE 4 % EX LIQD
60.0000 mL | Freq: Once | CUTANEOUS | Status: DC
  Filled 2014-06-30: qty 60

## 2014-06-30 MED ORDER — CEFAZOLIN SODIUM-DEXTROSE 2-3 GM-% IV SOLR
2.0000 g | INTRAVENOUS | Status: AC
  Administered 2014-07-01: 2 g via INTRAVENOUS
  Filled 2014-06-30: qty 50

## 2014-06-30 MED ORDER — LACTATED RINGERS IV SOLN
INTRAVENOUS | Status: DC
Start: 2014-06-30 — End: 2014-07-01
  Administered 2014-07-01: 09:00:00 via INTRAVENOUS

## 2014-07-01 ENCOUNTER — Encounter (HOSPITAL_COMMUNITY): Payer: Self-pay | Admitting: *Deleted

## 2014-07-01 ENCOUNTER — Ambulatory Visit (HOSPITAL_COMMUNITY): Payer: BC Managed Care – PPO | Admitting: Anesthesiology

## 2014-07-01 ENCOUNTER — Ambulatory Visit (HOSPITAL_COMMUNITY)
Admission: RE | Admit: 2014-07-01 | Discharge: 2014-07-01 | Disposition: A | Discharge status: Home / Self Care | Payer: BC Managed Care – PPO | Source: Ambulatory Visit | Attending: Orthopedic Surgery | Admitting: Orthopedic Surgery

## 2014-07-01 ENCOUNTER — Ambulatory Visit (HOSPITAL_COMMUNITY): Payer: BC Managed Care – PPO

## 2014-07-01 ENCOUNTER — Encounter (HOSPITAL_COMMUNITY)
Admission: RE | Disposition: A | Discharge status: Home / Self Care | Payer: Self-pay | Source: Ambulatory Visit | Attending: Orthopedic Surgery

## 2014-07-01 DIAGNOSIS — M7541 Impingement syndrome of right shoulder: Secondary | ICD-10-CM | POA: Diagnosis not present

## 2014-07-01 DIAGNOSIS — M25311 Other instability, right shoulder: Secondary | ICD-10-CM | POA: Diagnosis not present

## 2014-07-01 DIAGNOSIS — M75121 Complete rotator cuff tear or rupture of right shoulder, not specified as traumatic: Secondary | ICD-10-CM | POA: Diagnosis not present

## 2014-07-01 DIAGNOSIS — Z419 Encounter for procedure for purposes other than remedying health state, unspecified: Secondary | ICD-10-CM

## 2014-07-01 DIAGNOSIS — K219 Gastro-esophageal reflux disease without esophagitis: Secondary | ICD-10-CM | POA: Insufficient documentation

## 2014-07-01 HISTORY — PX: SHOULDER ARTHROSCOPY WITH ROTATOR CUFF REPAIR AND SUBACROMIAL DECOMPRESSION: SHX5686

## 2014-07-01 SURGERY — SHOULDER ARTHROSCOPY WITH ROTATOR CUFF REPAIR AND SUBACROMIAL DECOMPRESSION
Anesthesia: General | Site: Shoulder | Laterality: Right

## 2014-07-01 MED ORDER — GLYCOPYRROLATE 0.2 MG/ML IJ SOLN
INTRAMUSCULAR | Status: DC | PRN
  Administered 2014-07-01: 0.4 mg via INTRAVENOUS

## 2014-07-01 MED ORDER — DIAZEPAM 5 MG PO TABS
2.5000 mg | ORAL_TABLET | Freq: Four times a day (QID) | ORAL | Status: AC | PRN
Start: 2014-07-01 — End: ?

## 2014-07-01 MED ORDER — NEOSTIGMINE METHYLSULFATE 10 MG/10ML IV SOLN
INTRAVENOUS | Status: DC | PRN
  Administered 2014-07-01: 3 mg via INTRAVENOUS

## 2014-07-01 MED ORDER — PROPOFOL 10 MG/ML IV BOLUS
INTRAVENOUS | Status: DC | PRN
  Administered 2014-07-01: 150 mg via INTRAVENOUS

## 2014-07-01 MED ORDER — PROPOFOL 10 MG/ML IV BOLUS
INTRAVENOUS | Status: AC
  Filled 2014-07-01: qty 20

## 2014-07-01 MED ORDER — ROCURONIUM BROMIDE 50 MG/5ML IV SOLN
INTRAVENOUS | Status: AC
  Filled 2014-07-01: qty 1

## 2014-07-01 MED ORDER — FENTANYL CITRATE 0.05 MG/ML IJ SOLN
50.0000 ug | INTRAMUSCULAR | Status: DC | PRN
  Administered 2014-07-01: 100 ug via INTRAVENOUS
  Filled 2014-07-01: qty 2

## 2014-07-01 MED ORDER — MIDAZOLAM HCL 2 MG/2ML IJ SOLN
1.0000 mg | INTRAMUSCULAR | Status: DC | PRN
  Administered 2014-07-01: 2 mg via INTRAVENOUS
  Filled 2014-07-01: qty 2

## 2014-07-01 MED ORDER — LIDOCAINE HCL (CARDIAC) 20 MG/ML IV SOLN
INTRAVENOUS | Status: AC
  Filled 2014-07-01: qty 5

## 2014-07-01 MED ORDER — HYDROMORPHONE HCL 1 MG/ML IJ SOLN: 0.2500 mg | INTRAMUSCULAR | Status: DC | PRN

## 2014-07-01 MED ORDER — FENTANYL CITRATE 0.05 MG/ML IJ SOLN
INTRAMUSCULAR | Status: AC
  Filled 2014-07-01: qty 5

## 2014-07-01 MED ORDER — FENTANYL CITRATE 0.05 MG/ML IJ SOLN
INTRAMUSCULAR | Status: DC | PRN
  Administered 2014-07-01 (×2): 100 ug via INTRAVENOUS
  Administered 2014-07-01: 50 ug via INTRAVENOUS

## 2014-07-01 MED ORDER — ONDANSETRON HCL 4 MG/2ML IJ SOLN
INTRAMUSCULAR | Status: DC | PRN
  Administered 2014-07-01: 4 mg via INTRAVENOUS

## 2014-07-01 MED ORDER — BUPIVACAINE-EPINEPHRINE (PF) 0.5% -1:200000 IJ SOLN
INTRAMUSCULAR | Status: DC | PRN
  Administered 2014-07-01: 25 mL via PERINEURAL

## 2014-07-01 MED ORDER — LACTATED RINGERS IV SOLN
INTRAVENOUS | Status: DC | PRN
  Administered 2014-07-01 (×3): via INTRAVENOUS

## 2014-07-01 MED ORDER — EPHEDRINE SULFATE 50 MG/ML IJ SOLN
INTRAMUSCULAR | Status: DC | PRN
  Administered 2014-07-01 (×3): 5 mg via INTRAVENOUS

## 2014-07-01 MED ORDER — DEXAMETHASONE SODIUM PHOSPHATE 4 MG/ML IJ SOLN
INTRAMUSCULAR | Status: AC
  Filled 2014-07-01: qty 2

## 2014-07-01 MED ORDER — ROCURONIUM BROMIDE 100 MG/10ML IV SOLN
INTRAVENOUS | Status: DC | PRN
  Administered 2014-07-01: 40 mg via INTRAVENOUS

## 2014-07-01 MED ORDER — MIDAZOLAM HCL 5 MG/5ML IJ SOLN
INTRAMUSCULAR | Status: DC | PRN
  Administered 2014-07-01: 2 mg via INTRAVENOUS

## 2014-07-01 MED ORDER — OXYCODONE-ACETAMINOPHEN 5-325 MG PO TABS: 1.0000 | ORAL_TABLET | ORAL | Status: AC | PRN

## 2014-07-01 MED ORDER — NAPROXEN 500 MG PO TABS: 500.0000 mg | ORAL_TABLET | Freq: Two times a day (BID) | ORAL | Status: AC

## 2014-07-01 MED ORDER — SODIUM CHLORIDE 0.9 % IR SOLN
Status: DC | PRN
  Administered 2014-07-01 (×3): 3000 mL

## 2014-07-01 MED ORDER — ONDANSETRON HCL 4 MG/2ML IJ SOLN
INTRAMUSCULAR | Status: AC
  Filled 2014-07-01: qty 2

## 2014-07-01 MED ORDER — OXYCODONE HCL 5 MG/5ML PO SOLN: 5.0000 mg | Freq: Once | ORAL | Status: DC | PRN

## 2014-07-01 MED ORDER — MIDAZOLAM HCL 2 MG/2ML IJ SOLN
INTRAMUSCULAR | Status: AC
  Filled 2014-07-01: qty 2

## 2014-07-01 MED ORDER — NEOSTIGMINE METHYLSULFATE 10 MG/10ML IV SOLN
INTRAVENOUS | Status: AC
  Filled 2014-07-01: qty 1

## 2014-07-01 MED ORDER — DEXAMETHASONE SODIUM PHOSPHATE 4 MG/ML IJ SOLN
INTRAMUSCULAR | Status: DC | PRN
  Administered 2014-07-01: 8 mg via INTRAVENOUS

## 2014-07-01 MED ORDER — GLYCOPYRROLATE 0.2 MG/ML IJ SOLN
INTRAMUSCULAR | Status: AC
  Filled 2014-07-01: qty 2

## 2014-07-01 MED ORDER — OXYCODONE HCL 5 MG PO TABS: 5.0000 mg | ORAL_TABLET | Freq: Once | ORAL | Status: DC | PRN

## 2014-07-01 MED ORDER — SUCCINYLCHOLINE CHLORIDE 20 MG/ML IJ SOLN
INTRAMUSCULAR | Status: AC
  Filled 2014-07-01: qty 1

## 2014-07-01 MED ORDER — PROMETHAZINE HCL 25 MG/ML IJ SOLN: 6.2500 mg | INTRAMUSCULAR | Status: DC | PRN

## 2014-07-01 SURGICAL SUPPLY — 90 items
ANCH SUT 2 CRKSRW FT 14X4.5 (Anchor) ×1 IMPLANT
ANCH SUT SWLK 19.1X4.75 VT (Anchor) ×2 IMPLANT
ANCHOR PEEK 4.75X19.1 SWLK C (Anchor) ×4 IMPLANT
ANCHOR PEEK CORKSCREW 4.5 (Anchor) ×1 IMPLANT
ANCHOR PEEK CORKSCREW 4.5MM (Anchor) ×1 IMPLANT
BLADE CUTTER GATOR 3.5 (BLADE) ×3 IMPLANT
BLADE GREAT WHITE 4.2 (BLADE) ×2 IMPLANT
BLADE GREAT WHITE 4.2MM (BLADE) ×1
BLADE SURG 11 STRL SS (BLADE) ×3 IMPLANT
BOOTCOVER CLEANROOM LRG (PROTECTIVE WEAR) ×6 IMPLANT
BUR 3.5 LG SPHERICAL (BURR) IMPLANT
BUR OVAL 4.0 (BURR) ×3 IMPLANT
BURR 3.5 LG SPHERICAL (BURR)
BURR 3.5MM LG SPHERICAL (BURR)
CANISTER SUCT LVC 12 LTR MEDI- (MISCELLANEOUS) ×1 IMPLANT
CANNULA ACUFLEX KIT 5X76 (CANNULA) ×3 IMPLANT
CANNULA DRILOCK 5.0MMX75MM (CANNULA) ×2
CANNULA DRILOCK 5.0X75 (CANNULA) ×2 IMPLANT
CLOSURE WOUND 1/2 X4 (GAUZE/BANDAGES/DRESSINGS) ×1
CONNECTOR 5 IN 1 STRAIGHT STRL (MISCELLANEOUS) ×1 IMPLANT
COVER MAYO STAND STRL (DRAPES) ×2 IMPLANT
DRAPE INCISE 23X17 IOBAN STRL (DRAPES) ×2
DRAPE INCISE 23X17 STRL (DRAPES) ×1 IMPLANT
DRAPE INCISE IOBAN 23X17 STRL (DRAPES) ×1 IMPLANT
DRAPE INCISE IOBAN 66X45 STRL (DRAPES) ×3 IMPLANT
DRAPE ORTHO SPLIT 77X108 STRL (DRAPES) ×6
DRAPE STERI 35X30 U-POUCH (DRAPES) ×3 IMPLANT
DRAPE SURG 17X11 SM STRL (DRAPES) ×3 IMPLANT
DRAPE SURG ORHT 6 SPLT 77X108 (DRAPES) ×2 IMPLANT
DRAPE U-SHAPE 47X51 STRL (DRAPES) ×3 IMPLANT
DRAPE U-SHAPE 76X120 STRL (DRAPES) ×2 IMPLANT
DRSG MEPILEX BORDER 4X8 (GAUZE/BANDAGES/DRESSINGS) ×2 IMPLANT
DRSG PAD ABDOMINAL 8X10 ST (GAUZE/BANDAGES/DRESSINGS) ×6 IMPLANT
DURAPREP 26ML APPLICATOR (WOUND CARE) ×4 IMPLANT
ELECT REM PT RETURN 9FT ADLT (ELECTROSURGICAL) ×3
ELECTRODE REM PT RTRN 9FT ADLT (ELECTROSURGICAL) ×1 IMPLANT
GAUZE SPONGE 4X4 12PLY STRL (GAUZE/BANDAGES/DRESSINGS) ×3 IMPLANT
GLOVE BIO SURGEON STRL SZ7.5 (GLOVE) ×3 IMPLANT
GLOVE BIO SURGEON STRL SZ8 (GLOVE) ×3 IMPLANT
GLOVE EUDERMIC 7 POWDERFREE (GLOVE) ×3 IMPLANT
GLOVE SS BIOGEL STRL SZ 7.5 (GLOVE) ×1 IMPLANT
GLOVE SUPERSENSE BIOGEL SZ 7.5 (GLOVE) ×4
GOWN STRL REUS W/ TWL LRG LVL3 (GOWN DISPOSABLE) ×1 IMPLANT
GOWN STRL REUS W/ TWL XL LVL3 (GOWN DISPOSABLE) ×4 IMPLANT
GOWN STRL REUS W/TWL LRG LVL3 (GOWN DISPOSABLE) ×6
GOWN STRL REUS W/TWL XL LVL3 (GOWN DISPOSABLE) ×6
GRAFT TISS SEMITEND 4-8 (Bone Implant) IMPLANT
KIT AC JOINT DISP (KITS) ×2 IMPLANT
KIT BASIN OR (CUSTOM PROCEDURE TRAY) ×3 IMPLANT
KIT ROOM TURNOVER OR (KITS) ×3 IMPLANT
KIT SHOULDER TRACTION (DRAPES) ×1 IMPLANT
LIQUID BAND (GAUZE/BANDAGES/DRESSINGS) ×2 IMPLANT
MANIFOLD NEPTUNE II (INSTRUMENTS) ×3 IMPLANT
NDL SCORPION MULTI FIRE (NEEDLE) IMPLANT
NDL SPNL 18GX3.5 QUINCKE PK (NEEDLE) ×1 IMPLANT
NDL SUT 6 .5 CRC .975X.05 MAYO (NEEDLE) IMPLANT
NEEDLE MAYO TAPER (NEEDLE) ×3
NEEDLE SCORPION MULTI FIRE (NEEDLE) ×3 IMPLANT
NEEDLE SPNL 18GX3.5 QUINCKE PK (NEEDLE) ×3 IMPLANT
NS IRRIG 1000ML POUR BTL (IV SOLUTION) ×3 IMPLANT
PACK SHOULDER (CUSTOM PROCEDURE TRAY) ×3 IMPLANT
PAD ARMBOARD 7.5X6 YLW CONV (MISCELLANEOUS) ×6 IMPLANT
SET ARTHROSCOPY TUBING (MISCELLANEOUS) ×3
SET ARTHROSCOPY TUBING LN (MISCELLANEOUS) ×1 IMPLANT
SLING ARM FOAM STRAP LRG (SOFTGOODS) ×2 IMPLANT
SLING ARM LRG ADULT FOAM STRAP (SOFTGOODS) IMPLANT
SLING ARM MED ADULT FOAM STRAP (SOFTGOODS) ×1 IMPLANT
SPONGE LAP 4X18 X RAY DECT (DISPOSABLE) ×3 IMPLANT
STOCKINETTE IMPERVIOUS 9X36 MD (GAUZE/BANDAGES/DRESSINGS) ×2 IMPLANT
STRIP CLOSURE SKIN 1/2X4 (GAUZE/BANDAGES/DRESSINGS) ×2 IMPLANT
SUT FIBERWIRE #2 38 T-5 BLUE (SUTURE) ×9
SUT MNCRL AB 3-0 PS2 18 (SUTURE) ×3 IMPLANT
SUT PDS AB 0 CT 36 (SUTURE) ×2 IMPLANT
SUT RETRIEVER GRASP 30 DEG (SUTURE) IMPLANT
SUT RETRIEVER MED (INSTRUMENTS) ×2 IMPLANT
SUT VIC AB 1 CT1 27 (SUTURE) ×3
SUT VIC AB 1 CT1 27XBRD ANBCTR (SUTURE) IMPLANT
SUT VIC AB 2-0 CT1 27 (SUTURE) ×3
SUT VIC AB 2-0 CT1 TAPERPNT 27 (SUTURE) IMPLANT
SUTURE FIBERWR #2 38 T-5 BLUE (SUTURE) IMPLANT
SYR 20CC LL (SYRINGE) ×1 IMPLANT
TAPE PAPER 3X10 WHT MICROPORE (GAUZE/BANDAGES/DRESSINGS) ×1 IMPLANT
TENDON SEMI-TENDINOSUS (Bone Implant) ×3 IMPLANT
TOWEL OR 17X24 6PK STRL BLUE (TOWEL DISPOSABLE) ×3 IMPLANT
TOWEL OR 17X26 10 PK STRL BLUE (TOWEL DISPOSABLE) ×5 IMPLANT
TUBE CONNECTING 20'X1/4 (TUBING) ×1
TUBE CONNECTING 20X1/4 (TUBING) ×1 IMPLANT
WAND SUCTION MAX 4MM 90S (SURGICAL WAND) ×3 IMPLANT
WATER STERILE IRR 1000ML POUR (IV SOLUTION) ×1 IMPLANT
ZIPLOOP FIXATION AC JOINT DBL (Orthopedic Implant) ×2 IMPLANT

## 2014-07-01 NOTE — Anesthesia Preprocedure Evaluation (Addendum)
Anesthesia Evaluation  Patient identified by MRN, date of birth, ID band Patient awake    Reviewed: Allergy & Precautions, H&P , NPO status , Patient's Chart, lab work & pertinent test results  Airway Mallampati: II  TM Distance: >3 FB Neck ROM: Full    Dental  (+) Teeth Intact, Dental Advisory Given   Pulmonary neg pulmonary ROS,          Cardiovascular negative cardio ROS      Neuro/Psych negative neurological ROS  negative psych ROS   GI/Hepatic Neg liver ROS, GERD-  Medicated,  Endo/Other  negative endocrine ROS  Renal/GU negative Renal ROS     Musculoskeletal negative musculoskeletal ROS (+)   Abdominal   Peds  Hematology negative hematology ROS (+)   Anesthesia Other Findings   Reproductive/Obstetrics                            Anesthesia Physical Anesthesia Plan  ASA: I  Anesthesia Plan: General   Post-op Pain Management:    Induction: Intravenous  Airway Management Planned: Oral ETT  Additional Equipment:   Intra-op Plan:   Post-operative Plan: Extubation in OR  Informed Consent: I have reviewed the patients History and Physical, chart, labs and discussed the procedure including the risks, benefits and alternatives for the proposed anesthesia with the patient or authorized representative who has indicated his/her understanding and acceptance.   Dental advisory given  Plan Discussed with: CRNA and Surgeon  Anesthesia Plan Comments:         Anesthesia Quick Evaluation

## 2014-07-01 NOTE — H&P (Signed)
Zachary Russell    Chief Complaint: RIGHT SHOULDER IMPINGEMENT/ROTATOR CUFF TEAR, SOME INSTABILITY HPI: The patient is a 53 y.o. male with chronic right shoulder pain related to rotator cuff tendonopathy and possible tear, as well as chronic AC joint instability.  Past Medical History  Diagnosis Date  . GERD (gastroesophageal reflux disease)     uses nexium prn    Past Surgical History  Procedure Laterality Date  . Inguinal hernia repair  2007    left  . Tonsillectomy    . Ankle surgery      left  . Shoulder arthroscopy Right 2012  . Wrist arthroplasty      History reviewed. No pertinent family history.  Social History:  reports that he has never smoked. He has quit using smokeless tobacco. He reports that he drinks alcohol. He reports that he does not use illicit drugs.  Allergies: No Known Allergies  Medications Prior to Admission  Medication Sig Dispense Refill  . acetaminophen (TYLENOL) 500 MG tablet Take 1,000 mg by mouth every 6 (six) hours as needed.    . doxylamine, Sleep, (UNISOM) 25 MG tablet Take 25 mg by mouth at bedtime as needed for sleep.    . Esomeprazole Magnesium (NEXIUM PO) Take 22.3 mg by mouth daily as needed (for acid reflux/heartburn). *OTC strength nexium*    . ibuprofen (ADVIL,MOTRIN) 200 MG tablet Take 600 mg by mouth every 6 (six) hours as needed for mild pain or moderate pain.        Physical Exam: right shoulder with painful and restricted motion, with weakness and marked instability of AC joint.  Vitals  Temp:  [98.6 F (37 C)] 98.6 F (37 C) (11/19 0831) Pulse Rate:  [76-95] 95 (11/19 0915) Resp:  [9-18] 15 (11/19 0915) BP: (115-149)/(75-89) 148/75 mmHg (11/19 0910) SpO2:  [94 %-96 %] 96 % (11/19 0915) Weight:  [87.544 kg (193 lb)] 87.544 kg (193 lb) (11/19 0831)  Assessment/Plan  Impression: RIGHT SHOULDER IMPINGEMENT/ROTATOR CUFF TEAR, AC joint INSTABILITY  Plan of Action: Procedure(s): RIGHT SHOULDER ARTHROSCOPY WITH SUBACROMIAL  DECOMPRESSION/POSSIBLE ROTATOR CUFF REPAIR/OPEN RIGHT CORACOCLAVICULAR LIGAMENT  RECONSTRUCTION   Rica Heather M 07/01/2014, 9:42 AM

## 2014-07-01 NOTE — Op Note (Signed)
07/01/2014  1:17 PM  PATIENT:   Zachary Russell  53 y.o. male  PRE-OPERATIVE DIAGNOSIS:  RIGHT SHOULDER IMPINGEMENT/ROTATOR CUFF TEAR, AC joint  INSTABILITY  POST-OPERATIVE DIAGNOSIS:  same  PROCEDURE:  RSA, bursectomy, RCR, open reconstruction of CC ligaments  SURGEON:  Gissela Bloch, Vania ReaKevin M. M.D.  ASSISTANTS: Shuford pac   ANESTHESIA:   GET + ISB  EBL: 100  SPECIMEN:  none  Drains: none   PATIENT DISPOSITION:  PACU - hemodynamically stable.    PLAN OF CARE: Admit for overnight observation  Dictation# [id number]

## 2014-07-01 NOTE — Anesthesia Procedure Notes (Addendum)
Anesthesia Regional Block:  Interscalene brachial plexus block  Pre-Anesthetic Checklist: ,, timeout performed, Correct Patient, Correct Site, Correct Laterality, Correct Procedure, Correct Position, site marked, Risks and benefits discussed,  Surgical consent,  Pre-op evaluation,  At surgeon's request and post-op pain management   Prep: chloraprep       Needles:  Injection technique: Single-shot  Needle Type: Echogenic Stimulator Needle     Needle Length: 9cm 9 cm Needle Gauge: 21 and 21 G    Additional Needles:  Procedures: ultrasound guided (picture in chart) and nerve stimulator Interscalene brachial plexus block  Nerve Stimulator or Paresthesia:  Response: Deltoid, 0.5 mA,  Response: Biceps, 0.5 mA,   Additional Responses:   Narrative:  Start time: 07/01/2014 9:00 AM End time: 07/01/2014 9:00 AM Injection made incrementally with aspirations every 5 mL.  Performed by: Personally

## 2014-07-01 NOTE — Transfer of Care (Signed)
Immediate Anesthesia Transfer of Care Note  Patient: Zachary Russell  Procedure(s) Performed: Procedure(s): RIGHT SHOULDER ARTHROSCOPY WITH SUBACROMIAL DECOMPRESSION/POSSIBLE ROTATOR CUFF REPAIR/OPEN RIGHT CORACOCLAVICULAR LIGAMENT  RECONSTRUCTION  (Right)  Patient Location: PACU  Anesthesia Type:General and GA combined with regional for post-op pain  Level of Consciousness: awake, alert , oriented and patient cooperative  Airway & Oxygen Therapy: Patient Spontanous Breathing and Patient connected to nasal cannula oxygen  Post-op Assessment: Report given to PACU RN, Post -op Vital signs reviewed and stable and Patient moving all extremities  Post vital signs: Reviewed and stable  Complications: No apparent anesthesia complications

## 2014-07-01 NOTE — Discharge Instructions (Signed)
Vania ReaKevin M. Supple, M.D., F.A.A.O.S. Orthopaedic Surgery Specializing in Arthroscopic and Reconstructive Surgery of the Shoulder and Knee [phone] [address] [place], [place] [id number] - Fax [phone]  POST-OP SHOULDER ARTHROSCOPIC ROTATOR CUFF AND AC joint RECONSTRUCTION  INSTRUCTIONS  1. Call the office at [phone] to schedule your first post-op appointment 7-10 days from the date of your surgery.  2. Leave the steri-strips in place over your incisions when performing dressing changes and showering. You may remove your dressings and begin showering 72 hours from surgery. You can expect drainage that is clear to bloody in nature that occasionally will soak through your dressings. If this occurs go ahead and perform a dressing change. The drainage should lessen daily and when there is no drainage from your incisions feel free to go without a dressing.  You have a [facility] that has dermabond over the incision. This can tolerate water from a shower on Day 3 as well. You may keep a clean dressing across that incision as well. 3. Wear your sling/immobilizer at all times except to perform the exercises below or to occasionally let your arm dangle by your side to stretch your elbow. You also need to sleep in your sling immobilizer until instructed otherwise.  4. Range of motion to your elbow, wrist, and hand are encouraged 3-5 times daily. Exercise to your hand and fingers helps to reduce swelling you may experience.  5. Utilize ice to the shoulder 3-4 times minimum a day and additionally if you are experiencing pain.  6. You may one-armed drive when safely off of narcotics and muscle relaxants. You may use your hand that is in the sling to support the steering wheel only. However, should it be your right arm that is in the sling it is not to be used for gear shifting in a manual transmission.  7. If you had a block pre-operatively  to provide post-op pain relief you may want to go ahead and begin utilizing your pain meds as your arm begins to wake up. Blocks can sometimes last up to 16-18 hours. If you are still pain-free prior to going to bed you may want to strongly consider taking a pain medication to avoid being awakened in the night with the onset of pain. A muscle relaxant is also provided for you should you experience muscle spasms. It is recommended that if you are experiencing pain that your pain medication alone is not controlling, add the muscle relaxant along with the pain medication which can give additional pain relief. The first one to two days is generally the most severe of your pain and then should gradually decrease. As your pain lessens it is recommended that you decrease your use of the pain medications to an "as needed basis" only and to always comply with the recommended dosages of the pain medications.  8. Pain medications can produce constipation along with their use. If you experience this, the use of an over the counter stool softener or laxative daily is recommended.   9. For additional questions or concerns, please do not hesitate to call the office. If after hours there is an answering service to forward your concerns to the physician on call.  POST-OP EXERCISES  Pendulum Exercises  Perform pendulum exercises while standing and bending at the waist. Support your uninvolved arm on a table or chair and allow your operated arm to hang freely. Make sure to do these exercises passively - not  using you shoulder muscle.  Repeat 20 times. Do 3 sessions per day.  What to eat:  For your first meals, you should eat lightly; only small meals initially.  If you do not have nausea, you may eat larger meals.  Avoid spicy, greasy and heavy food.    General Anesthesia, Adult, Care After  Refer to this sheet in the next few weeks. These instructions provide you with information on caring for yourself after your  procedure. Your health care provider may also give you more specific instructions. Your treatment has been planned according to current medical practices, but problems sometimes occur. Call your health care provider if you have any problems or questions after your procedure.  WHAT TO EXPECT AFTER THE PROCEDURE  After the procedure, it is typical to experience:  Sleepiness.  Nausea and vomiting. HOME CARE INSTRUCTIONS  For the first 24 hours after general anesthesia:  Have a responsible person with you.  Do not drive a car. If you are alone, do not take public transportation.  Do not drink alcohol.  Do not take medicine that has not been prescribed by your health care provider.  Do not sign important papers or make important decisions.  You may resume a normal diet and activities as directed by your health care provider.  Change bandages (dressings) as directed.  If you have questions or problems that seem related to general anesthesia, call the hospital and ask for the anesthetist or anesthesiologist on call. SEEK MEDICAL CARE IF:  You have nausea and vomiting that continue the day after anesthesia.  You develop a rash. SEEK IMMEDIATE MEDICAL CARE IF:  You have difficulty breathing.  You have chest pain.  You have any allergic problems. Document Released: 11/05/2000 Document Revised: 04/01/2013 Document Reviewed: 02/12/2013  [facility] Patient Information 2014 [place] [place].

## 2014-07-01 NOTE — Op Note (Signed)
NAMEJOSEL, KEO NO.:  [id number]  MEDICAL RECORD NO.:  [id number]  LOCATION:  [facility]                         FACILITY:  [facility]  PHYSICIAN:  Vania Rea. Traeson Dusza, M.D.  DATE OF BIRTH:  June 18, 1961  DATE OF PROCEDURE:  07/01/2014 DATE OF DISCHARGE:  07/01/2014                              OPERATIVE REPORT   PREOPERATIVE DIAGNOSES: 1. 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 2. Right shoulder partial versus full-thickness rotator cuff tear. 3. 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  POSTOPERATIVE DIAGNOSES: 1. Chronic right shoulder impingement. 2. Right shoulder rotator cuff tear. 3. Right shoulder acromioclavicular joint instability.  PROCEDURE: 1. Right shoulder glenohumeral joint diagnostic arthroscopy. 2. Arthroscopic subacromial bursectomy and lysis of adhesions. 3. Arthroscopic rotator cuff repair. 4. Open reconstruction of the coracoclavicular ligaments using a zip     loop suture construct as well as semitendinosus allograft.  SURGEON:  Vania Rea. Melesio Madara, M.D.  Threasa HeadsFrench Ana A. Shuford, PA-C  ANESTHESIA:  General endotracheal as well as an interscalene block.  ESTIMATED BLOOD LOSS:  100 mL.  DRAINS:  None.  HISTORY:  Zachary Russell is a 53 year old gentleman, who has had chronic right shoulder pain wound related to an underlying impingement syndrome with an MRI scan showing evidence for a significant partial with not occult full-thickness rotator cuff tear.  Additionally, he has symptomatic instability of the acromioclavicular joint.  Due to his ongoing pain and functional limitations and failure to respond to conservative management, he is brought to the operating room at this time for planned right shoulder arthroscopy as well as planned open reconstruction of the coracoclavicular ligaments.  I preoperatively counseled Zachary Russell regarding treatment options and risks versus benefits thereof.  Possible  surgical complications were all reviewed including potential for bleeding, infection, neurovascular injury, persistent pain, loss of motion, anesthetic complication, and possible need for additional surgery.  He understands and accepts and agrees to planned procedure.  PROCEDURE IN DETAIL:  After undergoing routine preop evaluation, the patient received prophylactic antibiotics and an interscalene block was established in the holding area by the Anesthesia Department.  He was brought to the room, placed supine on the operative table, underwent smooth induction of a general endotracheal anesthesia.  Placed into beach-chair position and appropriately padded and protected.  The right shoulder girdle region was sterilely prepped and draped in standard fashion.  Time-out was called.  We established an initial posterior portal into the glenohumeral joint and diagnostic arthroscopy was performed.  The articular surface was found to be in excellent condition.  No instability patterns noted.  No obvious labral tearing. Biceps anchor was stable.  Biceps was normal in caliber and inspection from the articular side of the rotator cuff was intact.  At this point, the arthroscope was removed from the glenohumeral joint.  The arm was manipulated to 30 degrees of abduction and arthroscope was then reintroduced by the posterior portal in the subacromial space and direct lateral port was in the subacromial space.  Abundant dense bursal tissue and multiple adhesions were encountered and these were all divided and excised with a combination of shaver and Stryker wand.  The had been a previously performed subacromial  decompression and so the overall bony anatomy was acceptable with no obvious bony impingement, although significant bursal and scar tissue overgrowth was excised.  Inspection of the rotator cuff and the bursal side did indeed show an area of severe attenuation fraying and tearing based on the  bursal side involving the distal supraspinatus and probed this area.  This showed that there was just a very thin remaining veil of articular-sided tissues.  We went ahead and completed this defect creating a full- thickness rotator cuff tear of this approximately 2 cm in width.  The greater tuberosity was prepared removing soft tissue and ablating the bone to bleeding bed.  An accessory anterolateral portal was established through a stab wound, lateral margin of the acromion was placed an Arthrex PEEK corkscrew suture anchor.  The limbs of the suture anchor were then shuttled through the free margin of the rotator cuff utilizing the scorpion suture passer passing these limbs in a horizontal mattress pattern.  These were then tied with sliding locking knots followed by multiple overhand throws and alternating posts.  We then created "suture bridge" with 2 swivel lock suture anchors, which compressed the margin of the rotator cuff against the bony bed on the tuberosity and the overall construct was much to our satisfaction.  Suture limbs were clipped.  Bursectomy was completed.  Hemostasis was obtained.  Fluid was removed.  At this time, we then turned our attention to the distal clavicle and coracoid region, where a 6 cm saber incision was made over the clavicle towards the coracoid.  Skin flaps were elevated and electrocautery was used for hemostasis.  Dissection carried deeply. Skin flaps were mobilized.  Over the apex of the clavicle, I then performed a dissection elevating the anterior deltoid away from the clavicle and separating the fascia over the apex of the clavicle and mobilizing the trapezius posteriorly and this deltotrapezial interval was then developed from medial and lateral over the distal 4 cm of the clavicle.  Clearly, the distal clavicle was very unstable primarily in the anterior-posterior direction.  We gained circumferential exposure of the clavicle approximately 3  cm from the distal end.  At this point, then I dissected deeply towards the base of the coracoid separate in a soft tissues and then removed the soft tissues from the dorsal aspect of the coracoid gaining exposure of the dorsal surface.  At this point, then we performed placement of a drill hole through the distal clavicle approximately 3 cm from the distal end and then a corresponding drill hole through the base of the coracoid using the appropriate reamer off of the zip loop set.  We then used a double zip loop and past the initial loop through the clavicle and then down through the coracoid and flipped our fastening button and confirmed with fluoroscopic images that the button had appropriately flipped and was stable and good tension could be achieved.  At this point, we then prepared our semitendinosus allograft.  This was doubled and then passed through the first loop of our double zip loop and the zip loop was then tightened bringing the semitendinosus down to the dorsum of the coracoid.  As I mentioned the bone had been excoriated to enhance healing and good stability and fixation was achieved.  The medial limb of the allograft was then passed posteriorly behind the clavicle.  At this point, we then used the second limb of the zip loop to transfix a fastening button over the dorsum of the clavicle and  the second zip loop was then tightened which secured the clavicle down towards the coracoid reconstructing the coracoclavicular interval with the suture construct.  The allograft was then across the apex of the clavicle and was reinforced using the #2 fiber wire as well as the polyester braided suture on the zip loop construct to achieve solid fixation of our semitendinosus across the apex of the clavicle and the overall construct was much to our satisfaction with good stability and abundant biologic tissue as well as the suture construct recreating the CC ligaments.  We did obtain  the final fluoroscopic image confirming proper positioning of the hardware. Wound was irrigated.  Hemostasis was obtained.  The deltotrapezial interval was then reapproximated with a series of figure-of-eight #1 Vicryl sutures.  A 2-0 Vicryl were used for the subcu layer and intracuticular 3-0 Monocryl for the skin followed by a Dermabond and dry dressing and the portals closed with Monocryl and Steri-Strips and dry dressing taped at the right shoulder.  Right arm was placed in sling immobilizer.  The patient was awakened, extubated, and taken to recovery room in stable condition.  Ralene Batheracy Shuford, PA-C was used as an Geophysicist/field seismologistassistant throughout this case essential for help with positioning the patient, positioning the extremity, management of the arthroscopic equipment, tissue manipulation, wound closure, allograft preparation and intraoperative decision making.     Vania ReaKevin M. Starlina Lapre, M.D.     KMS/MEDQ  D:  07/01/2014  T:  07/01/2014  Job:  [id number]

## 2014-07-02 ENCOUNTER — Encounter (HOSPITAL_COMMUNITY): Payer: Self-pay | Admitting: Orthopedic Surgery

## 2014-07-06 NOTE — Anesthesia Postprocedure Evaluation (Signed)
  Anesthesia Post-op Note  Patient: Zachary Russell  Procedure(s) Performed: Procedure(s): RIGHT SHOULDER ARTHROSCOPY WITH SUBACROMIAL DECOMPRESSION/POSSIBLE ROTATOR CUFF REPAIR/OPEN RIGHT CORACOCLAVICULAR LIGAMENT  RECONSTRUCTION  (Right)  Patient Location: PACU  Anesthesia Type:GA combined with regional for post-op pain  Level of Consciousness: awake, alert  and oriented  Airway and Oxygen Therapy: Patient Spontanous Breathing  Post-op Pain: none  Post-op Assessment: Post-op Vital signs reviewed  Post-op Vital Signs: Reviewed  Last Vitals:  Filed Vitals:   07/01/14 1447  BP: 141/79  Pulse: 94  Temp:   Resp: 15    Complications: No apparent anesthesia complications

## 2015-04-07 IMAGING — CR DG ORBITS FOR FOREIGN BODY
2 series · 2 of 2 positions shown · non-contrast
Comparison: 06/11/2011

CLINICAL DATA: RIGHT shoulder pain, MRI clearance

EXAM:
ORBITS FOR FOREIGN BODY - 2 VIEW

[w waters (1 of 2)]
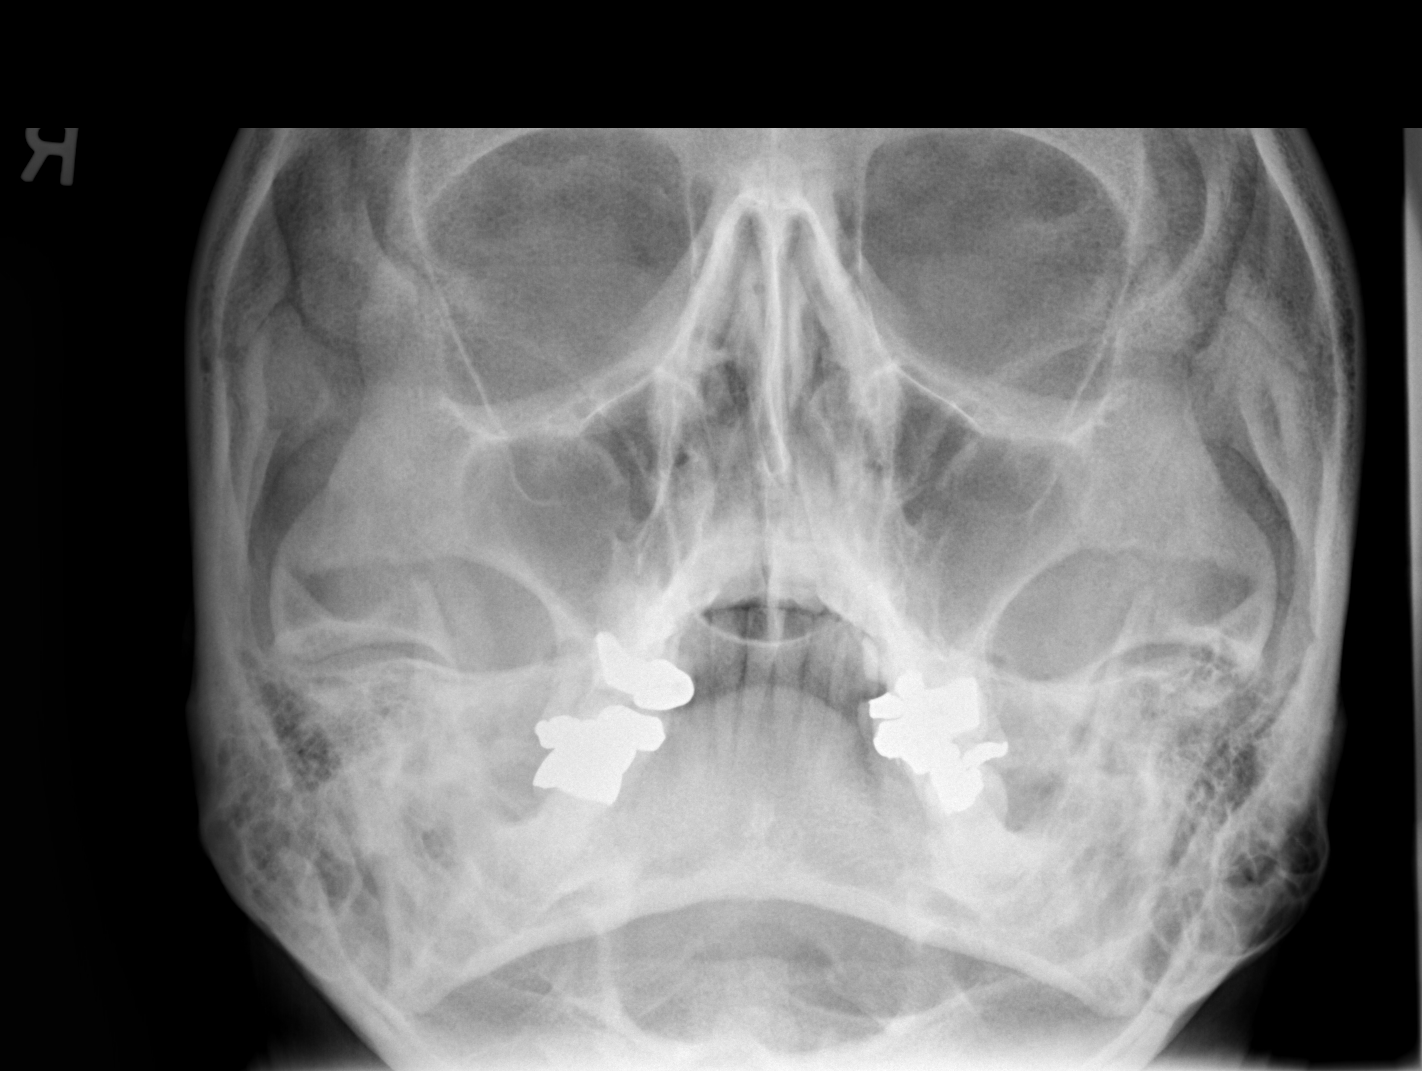

[w waters (2 of 2)]
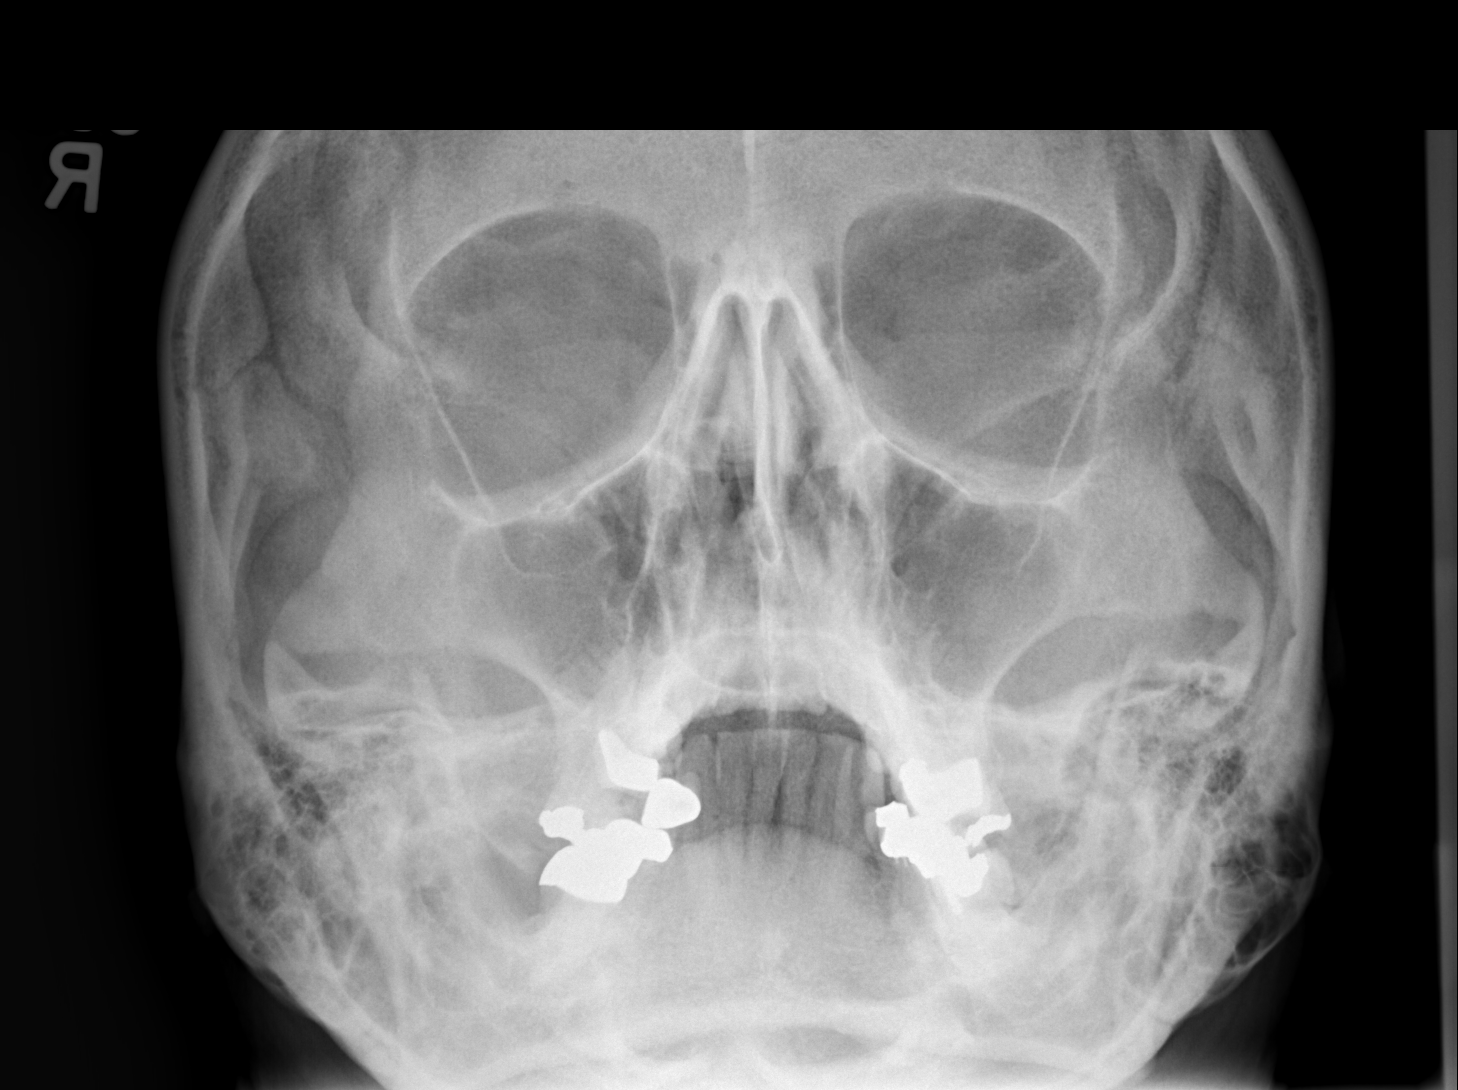

[2 of 2 positions shown; findings below may reference images not displayed]

FINDINGS: No orbital metallic foreign bodies.

Visualized bones and sinuses unremarkable.
IMPRESSION: No evidence of metallic foreign body within the orbits.

## 2015-07-15 IMAGING — RF DG C-ARM 61-120 MIN
1 series · 1 of 1 positions shown · IV contrast (agent unspecified)
Comparison: Single intraoperative view of the right shoulder
submitted.

CLINICAL DATA: Collecting surgery right shoulder

EXAM:
DG C-ARM 61-120 MIN; RIGHT SHOULDER - 1 VIEW
TECHNIQUE: Single intraoperative view of the right shoulder
CONTRAST:  None
FLUOROSCOPY TIME:  3 seconds

[Series 1: run · 1 of 1 slices shown]
[im 1/1]
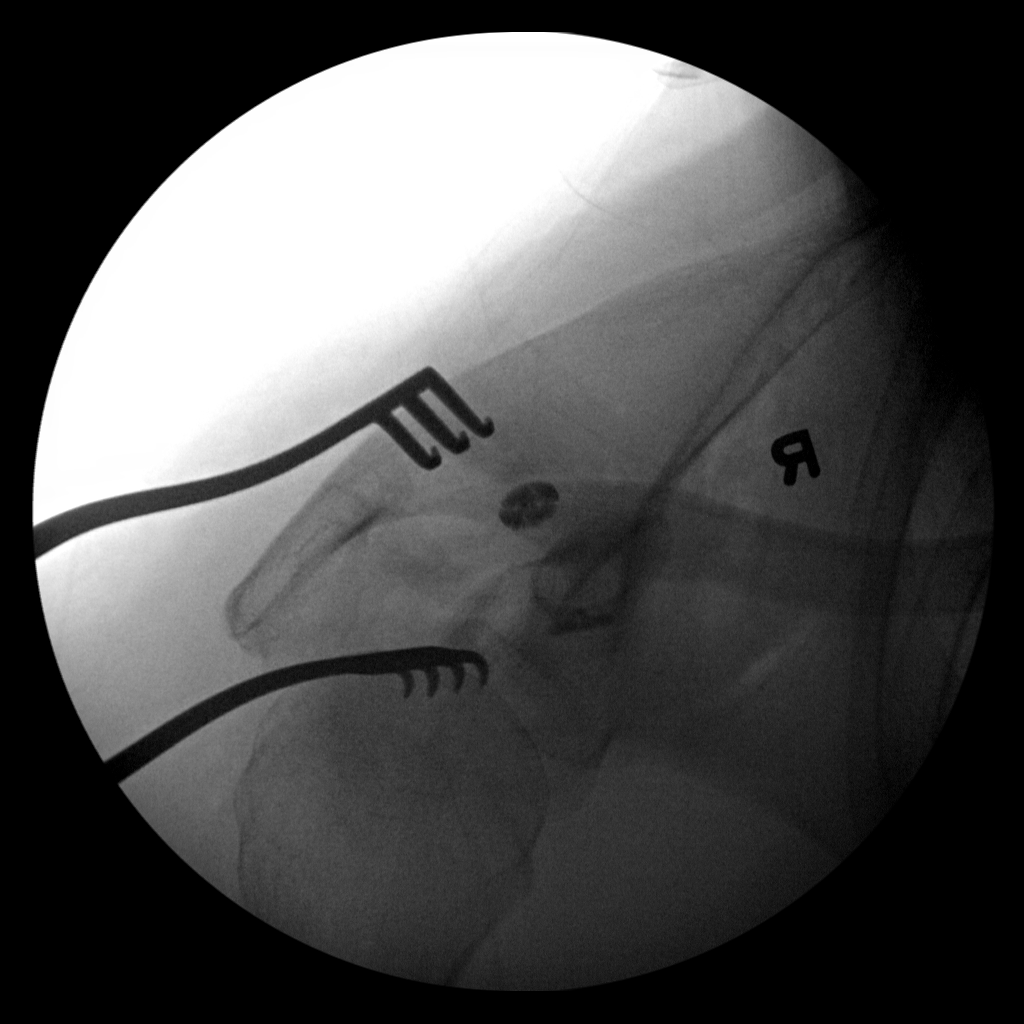

[1 of 1 positions shown; findings below may reference images not displayed]

Right shoulder in anatomic alignment. Metallic
instruments overlying the acromion and humeral head.
FINDINGS: Right shoulder in anatomic alignment. Metallic instruments overlying
the acromion and humeral head
